# Patient Record
Sex: Male | Born: 1960 | Race: Black or African American | Hispanic: Refuse to answer | Smoking: Former smoker
Health system: Southern US, Community
[De-identification: ages and names within clinical notes are randomized; demographics above are authoritative.]

## PROBLEM LIST (undated history)

## (undated) DIAGNOSIS — E119 Type 2 diabetes mellitus without complications: Secondary | ICD-10-CM

## (undated) DIAGNOSIS — I10 Essential (primary) hypertension: Secondary | ICD-10-CM

## (undated) DIAGNOSIS — G709 Myoneural disorder, unspecified: Secondary | ICD-10-CM

## (undated) DIAGNOSIS — E785 Hyperlipidemia, unspecified: Secondary | ICD-10-CM

## (undated) DIAGNOSIS — M1990 Unspecified osteoarthritis, unspecified site: Secondary | ICD-10-CM

## (undated) HISTORY — DX: Myoneural disorder, unspecified: G70.9

## (undated) HISTORY — DX: Unspecified osteoarthritis, unspecified site: M19.90

---

## 2008-02-01 ENCOUNTER — Emergency Department (HOSPITAL_COMMUNITY): Admission: EM | Admit: 2008-02-01 | Discharge: 2008-02-01 | Payer: Self-pay | Admitting: Emergency Medicine

## 2008-11-01 ENCOUNTER — Ambulatory Visit: Payer: Self-pay | Admitting: Family Medicine

## 2008-11-01 DIAGNOSIS — I10 Essential (primary) hypertension: Secondary | ICD-10-CM | POA: Insufficient documentation

## 2008-11-01 DIAGNOSIS — K5732 Diverticulitis of large intestine without perforation or abscess without bleeding: Secondary | ICD-10-CM | POA: Insufficient documentation

## 2008-11-01 DIAGNOSIS — L02818 Cutaneous abscess of other sites: Secondary | ICD-10-CM | POA: Insufficient documentation

## 2008-11-01 DIAGNOSIS — R519 Headache, unspecified: Secondary | ICD-10-CM | POA: Insufficient documentation

## 2008-11-01 DIAGNOSIS — R51 Headache: Secondary | ICD-10-CM | POA: Insufficient documentation

## 2008-11-01 DIAGNOSIS — G4730 Sleep apnea, unspecified: Secondary | ICD-10-CM | POA: Insufficient documentation

## 2008-11-01 DIAGNOSIS — L03818 Cellulitis of other sites: Secondary | ICD-10-CM

## 2008-11-01 LAB — CONVERTED CEMR LAB: Glucose, Bld: 86 mg/dL

## 2008-11-06 DIAGNOSIS — E669 Obesity, unspecified: Secondary | ICD-10-CM | POA: Insufficient documentation

## 2008-11-09 ENCOUNTER — Encounter: Payer: Self-pay | Admitting: Family Medicine

## 2008-11-21 ENCOUNTER — Encounter: Payer: Self-pay | Admitting: Family Medicine

## 2008-11-21 LAB — CONVERTED CEMR LAB
BUN: 10 mg/dL (ref 6–23)
Basophils Absolute: 0 10*3/uL (ref 0.0–0.1)
Basophils Relative: 0 % (ref 0–1)
CO2: 25 meq/L (ref 19–32)
Calcium: 9.9 mg/dL (ref 8.4–10.5)
Chloride: 104 meq/L (ref 96–112)
Cholesterol: 200 mg/dL (ref 0–200)
Creatinine, Ser: 1.19 mg/dL (ref 0.40–1.50)
Eosinophils Absolute: 0.2 10*3/uL (ref 0.0–0.7)
Eosinophils Relative: 2 % (ref 0–5)
Glucose, Bld: 101 mg/dL — ABNORMAL HIGH (ref 70–99)
HCT: 46.2 % (ref 39.0–52.0)
HDL: 50 mg/dL (ref >39)
Hemoglobin: 14.9 g/dL (ref 13.0–17.0)
LDL Cholesterol: 135 mg/dL — ABNORMAL HIGH (ref 0–99)
Lymphocytes Relative: 52 % — ABNORMAL HIGH (ref 12–46)
Lymphs Abs: 4.2 10*3/uL — ABNORMAL HIGH (ref 0.7–4.0)
MCHC: 32.3 g/dL (ref 30.0–36.0)
MCV: 85.1 fL (ref 78.0–100.0)
Monocytes Absolute: 0.4 10*3/uL (ref 0.1–1.0)
Monocytes Relative: 5 % (ref 3–12)
Neutro Abs: 3.3 10*3/uL (ref 1.7–7.7)
Neutrophils Relative %: 41 % — ABNORMAL LOW (ref 43–77)
PSA: 0.65 ng/mL (ref 0.10–4.00)
Platelets: 168 10*3/uL (ref 150–400)
Potassium: 5 meq/L (ref 3.5–5.3)
RBC: 5.43 M/uL (ref 4.22–5.81)
RDW: 13.1 % (ref 11.5–15.5)
Sodium: 141 meq/L (ref 135–145)
TSH: 1.18 microintl units/mL (ref 0.350–4.500)
Total CHOL/HDL Ratio: 4
Triglycerides: 76 mg/dL (ref <150)
VLDL: 15 mg/dL (ref 0–40)
WBC: 8.1 10*3/uL (ref 4.0–10.5)

## 2008-11-30 ENCOUNTER — Ambulatory Visit: Payer: Self-pay | Admitting: Family Medicine

## 2008-11-30 LAB — CONVERTED CEMR LAB: OCCULT 1: NEGATIVE

## 2008-12-06 ENCOUNTER — Encounter: Payer: Self-pay | Admitting: Family Medicine

## 2009-04-18 ENCOUNTER — Telehealth: Payer: Self-pay | Admitting: Family Medicine

## 2009-04-18 ENCOUNTER — Ambulatory Visit: Payer: Self-pay | Admitting: Family Medicine

## 2009-04-18 DIAGNOSIS — J019 Acute sinusitis, unspecified: Secondary | ICD-10-CM | POA: Insufficient documentation

## 2009-04-18 DIAGNOSIS — J209 Acute bronchitis, unspecified: Secondary | ICD-10-CM | POA: Insufficient documentation

## 2010-03-07 ENCOUNTER — Telehealth (INDEPENDENT_AMBULATORY_CARE_PROVIDER_SITE_OTHER): Payer: Self-pay | Admitting: *Deleted

## 2010-03-08 ENCOUNTER — Ambulatory Visit: Payer: Self-pay | Admitting: Family Medicine

## 2010-03-08 DIAGNOSIS — R7301 Impaired fasting glucose: Secondary | ICD-10-CM | POA: Insufficient documentation

## 2010-03-08 DIAGNOSIS — R5383 Other fatigue: Secondary | ICD-10-CM

## 2010-03-08 DIAGNOSIS — R5381 Other malaise: Secondary | ICD-10-CM | POA: Insufficient documentation

## 2010-05-15 ENCOUNTER — Ambulatory Visit: Admit: 2010-05-15 | Payer: Self-pay | Admitting: Family Medicine

## 2010-05-15 ENCOUNTER — Encounter: Payer: Self-pay | Admitting: Family Medicine

## 2010-05-29 NOTE — Progress Notes (Signed)
Summary: rx into another pharm  Phone Note Call from Patient   Summary of Call: wants to get rx called into [facility]. [phone] Initial call taken by: Rudene Anda,  April 18, 2009 3:57 PM    Prescriptions: TESSALON PERLES 100 MG CAPS (BENZONATATE) Take 1 capsule by mouth three times a day  #30 x 0   Entered by:   Everitt Amber   Authorized by:   Syliva Overman MD   Signed by:   Everitt Amber on 04/18/2009   Method used:   Electronically to        [facility] (retail)       [address]       [place], [place]  [address]       Ph: [phone]       Fax: [phone]   RxID:   [id number] SEPTRA DS 800-160 MG TABS (SULFAMETHOXAZOLE-TRIMETHOPRIM) Take 1 tablet by mouth two times a day  #20 x 0   Entered by:   Everitt Amber   Authorized by:   Syliva Overman MD   Signed by:   Everitt Amber on 04/18/2009   Method used:   Electronically to        [facility] (retail)       [address]       [place], [place]  [address]       Ph: [phone]       Fax: [phone]   RxID:   [id number]

## 2010-05-29 NOTE — Letter (Signed)
Summary: Letter  Letter   Imported By: Lind Guest 11/09/2008 14:16:36  _____________________________________________________________________  External Attachment:    Type:   Image     Comment:   External Document

## 2010-05-29 NOTE — Assessment & Plan Note (Signed)
Summary: 4 month follow up/cnd   Vital Signs:  Patient profile:   50 year old male Height:      69.5 inches Weight:      241.25 pounds BMI:     35.24 Pulse rate:   104 / minute Pulse rhythm:   regular Resp:     16 per minute BP sitting:   162 / 80  (left arm)  Vitals Entered By: Worthy Keeler LPN (April 18, 2009 2:02 PM)  Nutrition Counseling: Patient's BMI is greater than 25 and therefore counseled on weight management options. CC: follow-up visit Is Patient Diabetic? No Pain Assessment Patient in pain? no        Primary Care Provider:  Syliva Overman MD  CC:  follow-up visit.  History of Present Illness: Reports  thathe has been doing well up until the last 3 weeks when he developed progressiuve respiratory symptoms. Denies recent fever or chills.  Denies chest pain, palpitations, PND, orthopnea or leg swelling. Denies abdominal pain, nausea, vomitting, diarrhea or constipation. Denies change in bowel movements or bloody stool. Denies dysuria , frequency, incontinence or hesitancy. Denies  joint pain, swelling, or reduced mobility. Denies headaches, vertigo, seizures. Denies depression, anxiety or insomnia. Denies  rash, lesions, or itch.     Allergies (verified): 1)  ! Demerol  Review of Systems      See HPI General:  Denies chills and fever. Eyes:  Denies blurring and discharge. ENT:  Complains of hoarseness, nasal congestion, postnasal drainage, sinus pressure, and sore throat; 3 week history, just getting better , had yellow nasal drainage. CV:  Denies lightheadness, palpitations, and shortness of breath with exertion; pt remains physically active, however has noted recent exertional fatigue which is a cause for concern. Resp:  Complains of cough and sputum productive; 3 week history.clearing up in the past 3 days, yellow sputum, 2 sundays ago he developed flu like symptoms. GI:  Denies abdominal pain, bloody stools, change in bowel habits,  constipation, diarrhea, and indigestion. GU:  Denies dysuria, nocturia, urinary frequency, and urinary hesitancy. MS:  Denies joint pain and stiffness. Derm:  Denies itching and rash. Neuro:  Denies headaches, seizures, and sensation of room spinning. Psych:  Denies anxiety and depression. Endo:  Denies cold intolerance, excessive hunger, excessive thirst, excessive urination, heat intolerance, polyuria, and weight change. Heme:  Denies abnormal bruising and bleeding. Allergy:  Denies hives or rash and sneezing.  Physical Exam  General:  Well-developed,obese,in no acute distress; alert,appropriate and cooperative throughout examination HEENT: No facial asymmetry,  EOMI, positive sinus tenderness, TM's Clear, oropharynx  pink and moist.   Chest:decreased air entry, scattered crackles and wheezes CVS: S1, S2, No murmurs, No S3.   Abd: Soft, Nontender.  MS: Adequate ROM spine, hips, shoulders and knees.  Ext: No edema.   CNS: CN 2-12 intact, power tone and sensation normal throughout.   Skin: Intact, no visible lesions or rashes.  Psych: Good eye contact, normal affect.  Memory intact, not anxious or depressed appearing.    Impression & Recommendations:  Problem # 1:  ACUTE BRONCHITIS (ICD-466.0) Assessment Comment Only  His updated medication list for this problem includes:    Septra Ds 800-160 Mg Tabs (Sulfamethoxazole-trimethoprim) .Marland Kitchen... Take 1 tablet by mouth two times a day    Tessalon Perles 100 Mg Caps (Benzonatate) .Marland Kitchen... Take 1 capsule by mouth three times a day  Problem # 2:  ACUTE SINUSITIS, UNSPECIFIED (ICD-461.9) Assessment: Comment Only  His updated medication list for this problem includes:  Septra Ds 800-160 Mg Tabs (Sulfamethoxazole-trimethoprim) .Marland Kitchen... Take 1 tablet by mouth two times a day    Tessalon Perles 100 Mg Caps (Benzonatate) .Marland Kitchen... Take 1 capsule by mouth three times a day  Problem # 3:  HYPERTENSION (ICD-401.9) Assessment: Deteriorated  His  updated medication list for this problem includes:    Maxzide-25 37.5-25 Mg Tabs (Triamterene-hctz) .Marland Kitchen... Take 1 tablet by mouth once a day  Orders: T-Basic Metabolic Panel [id number])  BP today: 162/80 Prior BP: 124/94 (11/30/2008)  Labs Reviewed: K+: 5.0 (11/21/2008) Creat: : 1.19 (11/21/2008)   Chol: 200 (11/21/2008)   HDL: 50 (11/21/2008)   LDL: 135 (11/21/2008)   TG: 76 (11/21/2008)  Problem # 4:  OBESITY, UNSPECIFIED (ICD-278.00) Assessment: Deteriorated  Ht: 69.5 (04/18/2009)   Wt: 241.25 (04/18/2009)   BMI: 35.24 (04/18/2009)  Complete Medication List: 1)  Maxzide-25 37.5-25 Mg Tabs (Triamterene-hctz) .... Take 1 tablet by mouth once a day 2)  Septra Ds 800-160 Mg Tabs (Sulfamethoxazole-trimethoprim) .... Take 1 tablet by mouth two times a day 3)  Tessalon Perles 100 Mg Caps (Benzonatate) .... Take 1 capsule by mouth three times a day  Other Orders: T-Lipid Profile ([id number])  Patient Instructions: 1)  Please schedule a follow-up appointment in 4 months. 2)  Nurse BP check in 6 weeks 3)  It is important that you exercise regularly at least 20 minutes 5 times a week. If you develop chest pain, have severe difficulty breathing, or feel very tired , stop exercising immediately and seek medical attention. 4)  You need to lose weight. Consider a lower calorie diet and regular exercise.  5)  You are  being treated for sinusitis and bronchitis, meds have been sent in 6)  BMP prior to visit, ICD-9: 7)  Lipid Panel prior to visit, ICD-9:   fating in 4 months Prescriptions: TESSALON PERLES 100 MG CAPS (BENZONATATE) Take 1 capsule by mouth three times a day  #30 x 0   Entered and Authorized by:   Syliva Overman MD   Signed by:   Syliva Overman MD on 04/18/2009   Method used:   Electronically to        [facility] (retail)       [address]       [place], [place]  [address]       Ph: [phone]       Fax: [phone]   RxID:    [id number] SEPTRA DS 800-160 MG TABS (SULFAMETHOXAZOLE-TRIMETHOPRIM) Take 1 tablet by mouth two times a day  #20 x 0   Entered and Authorized by:   Syliva Overman MD   Signed by:   Syliva Overman MD on 04/18/2009   Method used:   Electronically to        [facility]* (retail)       [address]       [place], [place]  [address]       Ph: [phone]       Fax: [phone]   RxID:   [id number]

## 2010-05-29 NOTE — Assessment & Plan Note (Signed)
Summary: needs a note for work, and discuss other things/slj   Vital Signs:  Patient profile:   50 year old male Height:      69.5 inches Weight:      249.50 pounds BMI:     36.45 Pulse rate:   72 / minute Pulse rhythm:   regular BP sitting:   140 / 100  (right arm)  Vitals Entered By: Mauricia Area CMA (March 08, 2010 12:56 PM)  Nutrition Counseling: Patient's BMI is greater than 25 and therefore counseled on weight management options. CC: Follow up Comments Not currently taking any medications   Primary Care Provider:  Syliva Overman MD  CC:  Follow up.  History of Present Illness: 5 day h/o feverish, generalised body aches, chills, out of body type feeling, head congestion, yeloow green nasal drainage, with cough productive of yellow sputum. Has not been taking the BP meds, states cause Ed but will resume. Pt has gained a significant amt of weight is experiencing fatigue , and has plans to resume healthy lifestyle.  Current Medications (verified): 1)  None  Allergies (verified): 1)  ! Demerol  Review of Systems      See HPI General:  Complains of chills and fatigue. Eyes:  Denies discharge, double vision, eye pain, and itching. ENT:  Complains of nasal congestion, postnasal drainage, and sinus pressure; denies sore throat. CV:  Denies chest pain or discomfort, fatigue, palpitations, and swelling of feet. Resp:  Complains of cough, shortness of breath, sputum productive, and wheezing. GI:  Denies abdominal pain, constipation, diarrhea, nausea, and vomiting. GU:  Denies dysuria and urinary frequency. MS:  Denies joint pain and stiffness. Derm:  Denies itching, lesion(s), and rash. Neuro:  Complains of headaches. Psych:  Denies anxiety and depression. Endo:  Denies cold intolerance, excessive hunger, excessive thirst, and excessive urination. Heme:  Denies abnormal bruising and bleeding. Allergy:  Complains of seasonal allergies.  Physical Exam  General:   Well-developed,obese,in no acute distress; alert,appropriate and cooperative throughout examination HEENT: No facial asymmetry,  EOMI, frontal and maxillary sinus tenderness, TM's Clear, oropharynx  pink and moist.   Chest: decreased air entry, scattered crackles and wheezes CVS: S1, S2, No murmurs, No S3.   Abd: Soft, Nontender.  MS: Adequate ROM spine, hips, shoulders and knees.  Ext: No edema.   CNS: CN 2-12 intact, power tone and sensation normal throughout.   Skin: Intact, no visible lesions or rashes.  Psych: Good eye contact, normal affect.  Memory intact, not anxious or depressed appearing.    Impression & Recommendations:  Problem # 1:  ACUTE BRONCHITIS (ICD-466.0) Assessment Comment Only  The following medications were removed from the medication list:    Septra Ds 800-160 Mg Tabs (Sulfamethoxazole-trimethoprim) .Marland Kitchen... Take 1 tablet by mouth two times a day    Tessalon Perles 100 Mg Caps (Benzonatate) .Marland Kitchen... Take 1 capsule by mouth three times a day His updated medication list for this problem includes:    Penicillin V Potassium 500 Mg Tabs (Penicillin v potassium) .Marland Kitchen... Take 1 tablet by mouth three times a day    Tessalon Perles 100 Mg Caps (Benzonatate) .Marland Kitchen... Take 1 capsule by mouth three times a day  Problem # 2:  ACUTE SINUSITIS, UNSPECIFIED (ICD-461.9) Assessment: Comment Only  The following medications were removed from the medication list:    Septra Ds 800-160 Mg Tabs (Sulfamethoxazole-trimethoprim) .Marland Kitchen... Take 1 tablet by mouth two times a day    Tessalon Perles 100 Mg Caps (Benzonatate) .Marland Kitchen... Take 1  capsule by mouth three times a day His updated medication list for this problem includes:    Penicillin V Potassium 500 Mg Tabs (Penicillin v potassium) .Marland Kitchen... Take 1 tablet by mouth three times a day    Tessalon Perles 100 Mg Caps (Benzonatate) .Marland Kitchen... Take 1 capsule by mouth three times a day  Problem # 3:  HYPERTENSION (ICD-401.9) Assessment: Unchanged  The following  medications were removed from the medication list:    Maxzide-25 37.5-25 Mg Tabs (Triamterene-hctz) .Marland Kitchen... Take 1 tablet by mouth once a day His updated medication list for this problem includes:    Amlodipine Besylate 5 Mg Tabs (Amlodipine besylate) .Marland Kitchen... Take 1 tablet by mouth once a day  Orders: T-Basic Metabolic Panel [id number])  BP today: 140/100 Prior BP: 162/80 (04/18/2009)  Labs Reviewed: K+: 5.0 (11/21/2008) Creat: : 1.19 (11/21/2008)   Chol: 200 (11/21/2008)   HDL: 50 (11/21/2008)   LDL: 135 (11/21/2008)   TG: 76 (11/21/2008)  Problem # 4:  OBESITY, UNSPECIFIED (ICD-278.00) Assessment: Deteriorated  Ht: 69.5 (03/08/2010)   Wt: 249.50 (03/08/2010)   BMI: 36.45 (03/08/2010)  Complete Medication List: 1)  Amlodipine Besylate 5 Mg Tabs (Amlodipine besylate) .... Take 1 tablet by mouth once a day 2)  Penicillin V Potassium 500 Mg Tabs (Penicillin v potassium) .... Take 1 tablet by mouth three times a day 3)  Tessalon Perles 100 Mg Caps (Benzonatate) .... Take 1 capsule by mouth three times a day  Other Orders: T-Lipid Profile ([id number]) T-TSH [phone]) T-CBC w/Diff ([id number]) T-PSA (10272-53664) T- Hemoglobin A1C (40347-42595)  Patient Instructions: 1)  CPE in 6 to 8 weeks. 2)  You are being treated for sinusitis and bronchitis. 3)  You need to resume BP meds. Pls call if problems. 4)  It is important that you exercise regularly at least 30 minutes 5 times a week. If you develop chest pain, have severe difficulty breathing, or feel very tired , stop exercising immediately and seek medical attention. 5)  You need to lose weight. Consider a lower calorie diet and regular exercise.  6)  BMP prior to visit, ICD-9: 7)  Lipid Panel prior to visit, ICD-9: 8)  TSH prior to visit, ICD-9: 9)  CBC w/ Diff prior to visit, ICD-9: fasting labs today 10)  PSA prior to visit, ICD-9: 11)  HbgA1C prior to visit, ICD-9: 12)  Work excuse from Nov 6 to Mar 07, 2010 Prescriptions: TESSALON PERLES 100 MG CAPS (BENZONATATE) Take 1 capsule by mouth three times a day  #21 x 0   Entered and Authorized by:   Syliva Overman MD   Signed by:   Syliva Overman MD on 03/08/2010   Method used:   Electronically to        [facility] (retail)       [address]       [place], [place]  [address]       Ph: [phone]       Fax: [phone]   RxID:   [id number] PENICILLIN V POTASSIUM 500 MG TABS (PENICILLIN V POTASSIUM) Take 1 tablet by mouth three times a day  #21 x 0   Entered and Authorized by:   Syliva Overman MD   Signed by:   Syliva Overman MD on 03/08/2010   Method used:   Electronically to        [facility] (retail)       [address]       [place], [place]  [address]       Ph: [phone]       Fax: [phone]   RxID:   [id number] AMLODIPINE BESYLATE 5 MG TABS (AMLODIPINE BESYLATE) Take 1 tablet by mouth once a day  #30 x 3   Entered and Authorized by:   Syliva Overman MD   Signed by:   Syliva Overman MD on 03/08/2010   Method used:   Electronically to        [facility] (retail)       [address]       [place], [place]  [address]       Ph: [phone]       Fax: [phone]   RxID:   [id number]    Orders Added: 1)  Est. Patient Level IV [71062] 2)  T-Basic Metabolic Panel [id number] 3)  T-Lipid Profile [[id number]] 4)  T-TSH [[id number]] 5)  T-CBC w/Diff [[id number]] 6)  T-PSA [93810-17510] 7)  T- Hemoglobin A1C [83036-23375]

## 2010-05-29 NOTE — Assessment & Plan Note (Signed)
Summary: new patient   Vital Signs:  Patient profile:   50 year old male Height:      69.5 inches Weight:      225.56 pounds BMI:     32.95 Pulse rate:   71 / minute Pulse rhythm:   regular Resp:     16 per minute BP sitting:   150 / 98  (left arm)  Vitals Entered By: Worthy Keeler LPN (November 01, 452 3:51 PM)  Nutrition Counseling: Patient's BMI is greater than 25 and therefore counseled on weight management options. CC: new patient Is Patient Diabetic? No Pain Assessment Patient in pain? no        CC:  new patient.  History of Present Illness: Patient reports doing well.  Denies any recent fever or chills.  Denies any appetite change or change in bowel movements. Patient denies depression or anxiety.he is concerned about lackof sleep which he is getting espescially with the birth of his 86 month old infant daughter.  The pt denioes head or chest congestion. He denies sinus pressure, sore throat or productive cough. He denies dysuria, nocturia or frequency. he has concerns bout new headaches, a rash with swelling of the legs over thepast 1 week. he is concerned about recent weight gain which he states started when he stopped exercising.    Preventive Screening-Counseling & Management  Alcohol-Tobacco     Smoking Status: never  Caffeine-Diet-Exercise     Does Patient Exercise: yes      Drug Use:  no.    Past History:  Past Medical History: Current Problems:  HYPERTENSION (ICD-401.9)  approx 2007 DIVERTICULITIS, ACUTE (ICD-562.11) 2001, hospitalised at [facility] for 9 days Rightenal colic on 2 previous occasions, weekend stay twice , passed spontaneously both times, most recent 2009, then 2004 Headaches, occpital x 3 wee, sleeps about 4 hrs per night  Past Surgical History: Denies surgical history  Family History: Mother living- HTN Father deceased- CHF, HTN, DM died at age 60 of CHf 1 sister- sickle cell trait  Social History: Employed-  security Married 6 children (2 adults, 2 children, 2 small children) Never Smoked Alcohol use-no Drug use-no Regular exercise-yes Religion affecting care Smoking Status:  never Drug Use:  no Does Patient Exercise:  yes  Review of Systems      See HPI General:  Complains of sleep disorder; denies chills and fever; 4 hrs sleep. ENT:  Denies hoarseness, nasal congestion, and sinus pressure. CV:  Denies chest pain or discomfort, difficulty breathing while lying down, palpitations, shortness of breath with exertion, and swelling of hands. Resp:  See HPI; Complains of excessive snoring; denies cough, sputum productive, and wheezing. GI:  Denies abdominal pain, bloody stools, constipation, diarrhea, nausea, and vomiting. GU:  See HPI. MS:  Denies joint pain, loss of strength, low back pain, muscle aches, and stiffness. Derm:  Complains of itching, lesion(s), and rash; puriic red rash on on right leg x 6 days, felt a bite while picking berries, the lesion has progressively worsened, has noted yellow drainage, and and vesicular lesions, using zinc oxide.. Neuro:  Complains of headaches; denies numbness, poor balance, seizures, and sensation of room spinning; occipital, new severe headaches, no associated weakness or numbness.Marland Kitchen Psych:  See HPI. Endo:  Denies cold intolerance, excessive hunger, excessive thirst, excessive urination, heat intolerance, polyuria, and weight change.  Physical Exam  General:  alert, well-hydrated, and overweight-appearing. HEENT: No facial asymmetry,  EOMI, No sinus tenderness, TM's Clear, oropharynx  pink and moist.  Chest: Clear to auscultation bilaterally.  CVS: S1, S2, No murmurs, No S3.   Abd: Soft, Nontender.  MS: Adequate ROM spine, hips, shoulders and knees.  Ext: No edema.   CNS: CN 2-12 intact, power tone and sensation normal throughout.   Skin:extensive dermatitis of feet end legs with hyperpigmentation and vesicular lesions Psych: Good eye contact,  normal affect.  Memory intact, not anxious or depressed appearing.      Impression & Recommendations:  Problem # 1:  SLEEP APNEA, CHRONIC (ICD-780.57) Assessment Comment Only  Orders: Neurology Referral (Neuro), compelling h/o breath holding, snoring excessive dayrime sleepiness, study indicated  Problem # 2:  CELLULITIS AND ABSCESS OF OTHER SPECIFIED SITE 770 770 0575.8) Assessment: Comment Only  His updated medication list for this problem includes:    Doxycycline Hyclate 100 Mg Cpep (Doxycycline hyclate) .Marland Kitchen... Take 1 capsule by mouth two times a day  Orders: Depo- Medrol 80mg  (J1040) Rocephin  250mg  (A6301) Admin of Therapeutic Inj  intramuscular or subcutaneous (60109)  Problem # 3:  HEADACHE (ICD-784.0) Assessment: Deteriorated  Orders: Ketorolac-Toradol 15mg  (N2355) Neurology Referral (Neuro) Radiology Referral (Radiology)  Problem # 4:  HYPERTENSION (ICD-401.9) Assessment: Comment Only  His updated medication list for this problem includes:    Maxzide-25 37.5-25 Mg Tabs (Triamterene-hctz) .Marland Kitchen... Take 1 tablet by mouth once a day  BP today: 150/98, pt has been untreated in the past, new dx  Problem # 5:  DIVERTICULITIS, ACUTE (ICD-562.11) Assessment: Comment Only stable at this time  Problem # 6:  OBESITY, UNSPECIFIED (ICD-278.00) Assessment: Comment Only  Ht: 69.5 (11/01/2008)   Wt: 225.56 (11/01/2008)   BMI: 32.95 (11/01/2008)  Complete Medication List: 1)  Maxzide-25 37.5-25 Mg Tabs (Triamterene-hctz) .... Take 1 tablet by mouth once a day 2)  Prednisone (pak) 5 Mg Tabs (Prednisone) .... Use as directed 3)  Doxycycline Hyclate 100 Mg Cpep (Doxycycline hyclate) .... Take 1 capsule by mouth two times a day  Other Orders: T-Basic Metabolic Panel [phone]) T-Lipid Profile [phone]) T-CBC w/Diff [phone]) T-PSA [phone]) T-TSH [phone]) Tdap => 74yrs IM (81829) Admin 1st Vaccine (93716)  Patient Instructions: 1)  CPE in 5 weeks. 2)   You are being treated for dermatitisof both feet as wel las infection ofthe feet. pls do not scratch the area and avoid using any topical agents.pls call back if it is no better in the next 2 days for derm eval.  3)  your blood pressure is high yoiu are being started on medication for this. 4)    5)  BMP prior to visit, ICD-9: 6)  Lipid Panel prior to visit, ICD-9:  fasting in 5 weeks pls. 7)  TSH prior to visit, ICD-9: 8)  CBC w/ Diff prior to visit, ICD-9: 9)  PSA prior to visit, ICD-9: 10)  You will be referred to neurology to eval your headaches and for a scan Prescriptions: DOXYCYCLINE HYCLATE 100 MG CPEP (DOXYCYCLINE HYCLATE) Take 1 capsule by mouth two times a day  #20 x 0   Entered and Authorized by:   Syliva Overman MD   Signed by:   Syliva Overman MD on 11/01/2008   Method used:   Electronically to        [facility]* (retail)       [address]       [place], [place]  [address]       Ph: [phone]       Fax: [phone]   RxID:   [id number]  PREDNISONE (PAK) 5 MG TABS (PREDNISONE) Use as directed  #21 x 0   Entered and Authorized by:   Syliva Overman MD   Signed by:   Syliva Overman MD on 11/01/2008   Method used:   Electronically to        [facility]* (retail)       [address]       [place], [place]  [address]       Ph: [phone]       Fax: [phone]   RxID:   [id number] MAXZIDE-25 37.5-25 MG TABS (TRIAMTERENE-HCTZ) Take 1 tablet by mouth once a day  #30 x 2   Entered and Authorized by:   Syliva Overman MD   Signed by:   Syliva Overman MD on 11/01/2008   Method used:   Print then Give to Patient   RxID:   [id number]    Tetanus/Td Vaccine    Vaccine Type: Tdap    Site: right deltoid    Mfr: [email or web address]    Dose: 0.5 ml    Route: IM    Given by: Worthy Keeler LPN    Exp. Date: 06/22/2010    Lot #: [id number]    VIS given: 03/17/07 version given November 01, 2008.    Medication  Administration  Injection # 1:    Medication: Depo- Medrol 80mg     Diagnosis: CELLULITIS AND ABSCESS OF OTHER SPECIFIED SITE 772 722 6236.8)    Route: IM    Site: RUOQ gluteus    Exp Date: 1/13    Lot #: [id number]    Mfr: Pharmacia    Patient tolerated injection without complications    Given by: Worthy Keeler LPN (November 01, 624 4:48 PM)  Injection # 2:    Medication: Ketorolac-Toradol 15mg     Diagnosis: HEADACHE (ICD-784.0)    Route: IM    Site: LUOQ gluteus    Exp Date: 05/30/2010    Lot #: [id number]    Mfr: [email or web address]    Comments: toradol 60mg  given    Patient tolerated injection without complications    Given by: Worthy Keeler LPN (November 01, 7033 4:54 PM)  Injection # 3:    Medication: Rocephin  250mg     Diagnosis: CELLULITIS AND ABSCESS OF OTHER SPECIFIED SITE (ICD-682.8)    Route: IM    Site: R deltoid    Exp Date: 4/12    Lot #: [id number]    Mfr: [email or web address]    Comments: rocephin 500mg  given    Patient tolerated injection without complications    Given by: Worthy Keeler LPN (November 01, 8297 4:55 PM)  Orders Added: 1)  New Patient Level IV [99204] 2)  T-Basic Metabolic Panel [phone] 3)  T-Lipid Profile [[id number]] 4)  T-CBC w/Diff [[id number]] 5)  T-PSA [85277-82423] 6)  T-TSH [53614-43154] 7)  Tdap => 13yrs IM [90715] 8)  Admin 1st Vaccine [90471] 9)  Depo- Medrol 80mg  [J1040] 10)  Ketorolac-Toradol 15mg  [J1885] 11)  Rocephin  250mg  [J0696] 12)  Admin of Therapeutic Inj  intramuscular or subcutaneous [96372] 13)  Neurology Referral [Neuro] 14)  Neurology Referral [Neuro] 15)  Radiology Referral [Radiology]   Laboratory Results   Blood Tests   Date/Time Received: 11/01/08 4:30p Date/Time Reported: 11/01/08 4:30p  Glucose (random): 86 mg/dL   (Normal Range: 00-867)

## 2010-05-29 NOTE — Assessment & Plan Note (Signed)
Summary: physcial   Vital Signs:  Patient profile:   50 year old male Height:      69.5 inches Weight:      217 pounds BMI:     31.70 O2 Sat:      97 % Pulse rate:   87 / minute Pulse rhythm:   regular Resp:     16 per minute BP sitting:   124 / 94  (left arm) Cuff size:   large  Vitals Entered By: Everitt Amber (November 30, 2008 3:10 PM)  Nutrition Counseling: Patient's BMI is greater than 25 and therefore counseled on weight management options. CC: PHYSICAL Is Patient Diabetic? No  Vision Screening:Left eye with correction: 20 / 15 Right eye with correction: 20 / 15 Both eyes with correction: 20 / 15  Color vision testing: normal      Vision Entered By: Everitt Amber (November 30, 2008 3:19 PM)   CC:  PHYSICAL.  History of Present Illness: Pt is in for a complete exam and folow up on severe cellulitis.  He reports excellent responseto the meds for the skin infection. He has also changed his diet and commited to regular exercise with excellent weight loss .He denies intolerance  to the antihypertensive medication that was recently started, He has had no recent fver or chills,. He dneies head or chest congestion. He denies poor stream, nocturia or frequency. He has noted problems with erectile function since starting the bP med.   Current Medications (verified): 1)  Maxzide-25 37.5-25 Mg Tabs (Triamterene-Hctz) .... Take 1 Tablet By Mouth Once A Day  Allergies (verified): 1)  ! Demerol  Review of Systems General:  Denies fever. Eyes:  Denies blurring and discharge. ENT:  Denies earache, hoarseness, nasal congestion, sinus pressure, and sore throat. CV:  Denies chest pain or discomfort, leg cramps with exertion, near fainting, palpitations, shortness of breath with exertion, and swelling of feet. Resp:  Denies cough and sputum productive. GI:  Denies abdominal pain, constipation, diarrhea, indigestion, nausea, and vomiting blood. GU:  See HPI. MS:  Denies joint pain,  low back pain, mid back pain, and stiffness. Derm:  See HPI. Neuro:  Denies falling down, headaches, sensation of room spinning, and tingling. Psych:  Denies anxiety, depression, irritability, and mental problems. Endo:  Denies cold intolerance, excessive hunger, excessive thirst, excessive urination, and polyuria. Heme:  Denies abnormal bruising and bleeding. Allergy:  Denies hives or rash and itching eyes.  Physical Exam  General:  alert, well-hydrated, and overweight-appearing.   Head:  Normocephalic and atraumatic without obvious abnormalities. No apparent alopecia or balding. Eyes:  No corneal or conjunctival inflammation noted. EOMI. Perrla. Funduscopic exam benign, without hemorrhages, exudates or papilledema. Vision grossly normal. Ears:  External ear exam shows no significant lesions or deformities.  Otoscopic examination reveals clear canals, tympanic membranes are intact bilaterally without bulging, retraction, inflammation or discharge. Hearing is grossly normal bilaterally. Nose:  External nasal examination shows no deformity or inflammation. Nasal mucosa are pink and moist without lesions or exudates. Mouth:  Oral mucosa and oropharynx without lesions or exudates.  Teeth in good repair. Neck:  No deformities, masses, or tenderness noted. Chest Wall:  No deformities, masses, tenderness or gynecomastia noted. Breasts:  No masses or gynecomastia noted Lungs:  Normal respiratory effort, chest expands symmetrically. Lungs are clear to auscultation, no crackles or wheezes. Heart:  Normal rate and regular rhythm. S1 and S2 normal without gallop, murmur, click, rub or other extra sounds. Abdomen:  Bowel  sounds positive,abdomen soft and non-tender without masses, organomegaly or hernias noted. Rectal:  No external abnormalities noted. Normal sphincter tone. No rectal masses or tenderness.guaic negative stool Genitalia:  Testes bilaterally descended without nodularity, tenderness or  masses. No scrotal masses or lesions. No penis lesions or urethral discharge. Prostate:  Prostate gland firm and smooth, no enlargement, nodularity, tenderness, mass, asymmetry or induration. Msk:  No deformity or scoliosis noted of thoracic or lumbar spine.   Pulses:  R and L carotid,radial,femoral,dorsalis pedis and posterior tibial pulses are full and equal bilaterally Extremities:  No clubbing, cyanosis, edema, or deformity noted with normal full range of motion of all joints.   Neurologic:  No cranial nerve deficits noted. Station and gait are normal. Plantar reflexes are down-going bilaterally. DTRs are symmetrical throughout. Sensory, motor and coordinative functions appear intact. Skin:  Intact without suspicious lesions or rashes Cervical Nodes:  No lymphadenopathy noted Axillary Nodes:  No palpable lymphadenopathy Inguinal Nodes:  No significant adenopathy Psych:  Cognition and judgment appear intact. Alert and cooperative with normal attention span and concentration. No apparent delusions, illusions, hallucinations   Impression & Recommendations:  Problem # 1:  PHYSICAL EXAMINATION (ICD-V70.0) Assessment Comment Only healthy lifestyle discussed and encouraged . pt applauded on the changes he has made which have been succesfukl and encouraged to continue same.  Problem # 2:  OBESITY, UNSPECIFIED (ICD-278.00) Assessment: Improved  Ht: 69.5 (11/30/2008)   Wt: 217 (11/30/2008)   BMI: 31.70 (11/30/2008)  Problem # 3:  CELLULITIS AND ABSCESS OF OTHER SPECIFIED SITE [phone]) Assessment: Improved  The following medications were removed from the medication list:    Doxycycline Hyclate 100 Mg Cpep (Doxycycline hyclate) .Marland Kitchen... Take 1 capsule by mouth two times a day  Problem # 4:  HYPERTENSION (ICD-401.9) Assessment: Improved  His updated medication list for this problem includes:    Maxzide-25 37.5-25 Mg Tabs (Triamterene-hctz) .Marland Kitchen... Take 1 tablet by mouth once a  day  Orders: EKG w/ Interpretation (93000), needs to be repeated, original sent to Dr. Dietrich Pates for review appropriate lead placement discussed, pt to be asked to come in when able to have thias repeated before card referral. He is asymptomatic at this time.  BP today: 124/94 Prior BP: 150/98 (11/01/2008)  Labs Reviewed: K+: 5.0 (11/21/2008) Creat: : 1.19 (11/21/2008)   Chol: 200 (11/21/2008)   HDL: 50 (11/21/2008)   LDL: 135 (11/21/2008)   TG: 76 (11/21/2008)  Problem # 5:  HEADACHE (ICD-784.0) Assessment: Improved  Orders: EKG w/ Interpretation (93000)  Complete Medication List: 1)  Maxzide-25 37.5-25 Mg Tabs (Triamterene-hctz) .... Take 1 tablet by mouth once a day  Other Orders: Hemoccult Guaiac-1 spec.(in office) (04540)  Patient Instructions: 1)  Please schedule a follow-up appointment in 4 months. 2)  It is important that you exercise regularly at least 20 minutes 5 times a week. If you develop chest pain, have severe difficulty breathing, or feel very tired , stop exercising immediately and seek medical attention. 3)  You need to lose weight. Consider a lower calorie diet and regular exercise.  4)  blood pressure is much improved, 124/90. 5)  pls continue all the greatr lifestyle changes.Continue yout current tmed. 6)  pLS keep your sugar and carb intake down so that you do not become a diabetic    Laboratory Results  Date/Time Received: 11/30/08 Date/Time Reported: 11/30/08  Stool - Occult Blood Hemmoccult #1: negative Date: 11/30/2008 Comments: [id number] L 3/12 118 10/12    Appended Document: physcial pls contact the pt andask  him to come in at his convenience , asap , for a rept eKG, best wait till i get back to do it, the Monday would be a good day, nop additional chg, explain that i had the cardiologist review the EKG and it needs to be repeated before any decision can be made as to whether it isabnormal to need a referral to cardiology. let him know that the same  card will review the rept EKG, sorry about the inconvenience  Appended Document: physcial Returned call, no answer  Appended Document: physcial Will come in Monday

## 2010-05-29 NOTE — Progress Notes (Signed)
Summary: Please Help  Phone Note Call from Patient   Summary of Call: patient states he has had a virus at the beginning of the week, his employer states he needs a doctor's note in order to go back to work, I informed him we didn't have an appt. this week nor next week.  He wants to come in and discuss the note for work and some other things. Please advise where I can put patient. Initial call taken by: Curtis Sites,  March 07, 2010 8:21 AM  Follow-up for Phone Call        patient has not been seen in this office in a year Follow-up by: Adella Hare LPN,  March 07, 2010 10:26 AM  Additional Follow-up for Phone Call Additional follow up Details #1::        needs to go to urgent care4 if there are no abvailable openings, pls explain to him     Additional Follow-up for Phone Call Additional follow up Details #2::    patient wants to come in here, I put him on schedule for tomorrow at 1. Follow-up by: Curtis Sites,  March 07, 2010 2:29 PM

## 2010-05-31 NOTE — Letter (Signed)
Summary: 1st missed letter  1st missed letter   Imported By: Lind Guest 05/16/2010 10:25:32  _____________________________________________________________________  External Attachment:    Type:   Image     Comment:   External Document

## 2011-01-29 LAB — URINE MICROSCOPIC-ADD ON

## 2011-01-29 LAB — URINALYSIS, ROUTINE W REFLEX MICROSCOPIC
Bilirubin Urine: NEGATIVE
Glucose, UA: NEGATIVE
Leukocytes, UA: NEGATIVE
Nitrite: NEGATIVE
Specific Gravity, Urine: >1.03 — ABNORMAL HIGH
Urobilinogen, UA: 0.2
pH: 5

## 2011-03-18 ENCOUNTER — Encounter: Payer: Self-pay | Admitting: Family Medicine

## 2011-03-28 ENCOUNTER — Encounter: Payer: Self-pay | Admitting: Family Medicine

## 2011-03-28 ENCOUNTER — Ambulatory Visit (INDEPENDENT_AMBULATORY_CARE_PROVIDER_SITE_OTHER): Payer: PRIVATE HEALTH INSURANCE | Admitting: Family Medicine

## 2011-03-28 VITALS — BP 150/84 | HR 128 | Temp 99.9°F | Resp 18 | Ht 69.5 in | Wt 264.0 lb

## 2011-03-28 DIAGNOSIS — R7301 Impaired fasting glucose: Secondary | ICD-10-CM

## 2011-03-28 DIAGNOSIS — I10 Essential (primary) hypertension: Secondary | ICD-10-CM

## 2011-03-28 DIAGNOSIS — E669 Obesity, unspecified: Secondary | ICD-10-CM

## 2011-03-28 DIAGNOSIS — R5381 Other malaise: Secondary | ICD-10-CM

## 2011-03-28 DIAGNOSIS — J111 Influenza due to unidentified influenza virus with other respiratory manifestations: Secondary | ICD-10-CM

## 2011-03-28 DIAGNOSIS — R5383 Other fatigue: Secondary | ICD-10-CM

## 2011-03-28 DIAGNOSIS — R51 Headache: Secondary | ICD-10-CM

## 2011-03-28 DIAGNOSIS — J019 Acute sinusitis, unspecified: Secondary | ICD-10-CM

## 2011-03-28 DIAGNOSIS — K5732 Diverticulitis of large intestine without perforation or abscess without bleeding: Secondary | ICD-10-CM

## 2011-03-28 DIAGNOSIS — J209 Acute bronchitis, unspecified: Secondary | ICD-10-CM

## 2011-03-28 MED ORDER — SULFAMETHOXAZOLE-TRIMETHOPRIM 800-160 MG PO TABS: 1.0000 | ORAL_TABLET | Freq: Two times a day (BID) | ORAL | Status: AC

## 2011-03-28 MED ORDER — CEFTRIAXONE SODIUM 1 G IJ SOLR
500.0000 mg | Freq: Once | INTRAMUSCULAR | Status: AC
  Administered 2011-03-28: 500 mg via INTRAMUSCULAR

## 2011-03-28 MED ORDER — TRIAMTERENE-HCTZ 37.5-25 MG PO TABS: 1.0000 | ORAL_TABLET | Freq: Every day | ORAL | Status: DC

## 2011-03-28 MED ORDER — KETOROLAC TROMETHAMINE 60 MG/2ML IJ SOLN
60.0000 mg | Freq: Once | INTRAMUSCULAR | Status: AC
  Administered 2011-03-28: 60 mg via INTRAMUSCULAR

## 2011-03-28 MED ORDER — BENZONATATE 100 MG PO CAPS: 100.0000 mg | ORAL_CAPSULE | Freq: Four times a day (QID) | ORAL | Status: DC | PRN

## 2011-03-28 MED ORDER — PROMETHAZINE-DM 6.25-15 MG/5ML PO SYRP: ORAL_SOLUTION | ORAL | Status: AC

## 2011-03-28 NOTE — Progress Notes (Signed)
  Subjective:    Patient ID: Roger Prince, male    DOB: October 05, 1960, 50 y.o.   MRN: [id number]  HPI 4 day h/o body aches, chills, cough productive of green sputum and headache. Prior to this has been well, except has gained a lot of weight, stopped blood pressure medication ad has not been exercising   Review of Systems See HPI  Denies chest pains, palpitations and leg swelling Denies abdominal pain, nausea, vomiting,diarrhea or constipation.   Denies dysuria, frequency, hesitancy or incontinence. Denies joint pain, swelling and limitation in mobility. Denies seizures, numbness, or tingling. Denies depression, anxiety or insomnia. Denies skin break down or rash.        Objective:   Physical Exam Patient alert and oriented and in no cardiopulmonary distress.Ill appearing  HEENT: No facial asymmetry, EOMI, no sinus tenderness,  oropharynx pink and moist.  Neck supple no adenopathy.  Chest: decreased air entry, scattered crackles , no wheezes  CVS: S1, S2 no murmurs, no S3.  ABD: Soft non tender. Bowel sounds normal.  Ext: No edema  MS: Adequate ROM spine, shoulders, hips and knees.  Skin: Intact, no ulcerations or rash noted.  Psych: Good eye contact, normal affect. Memory intact not anxious or depressed appearing.  CNS: CN 2-12 intact, power, tone and sensation normal throughout.        Assessment & Plan:

## 2011-03-28 NOTE — Assessment & Plan Note (Signed)
toradol in the office tylenol every 6 hrs as needed

## 2011-03-28 NOTE — Assessment & Plan Note (Signed)
Rocephin in the office, tessalon perles, septra and phenergan dm prescribed, work excuse from today to return 04/01/2011

## 2011-03-28 NOTE — Assessment & Plan Note (Signed)
Uncontrolled, resume medication 

## 2011-03-28 NOTE — Patient Instructions (Signed)
CPE in 2 month  You are being treated for acute bronchitis with influenza.   You will get toradol in the office for headache, also rocephin for bronchitis. Antibiotics and other medication is sent to the pharmacy  Your blood pressure is high, medication is sent in that you need to resume   It is important that you exercise regularly at least 30 minutes 5 times a week. If you develop chest pain, have severe difficulty breathing, or feel very tired, stop exercising immediately and seek medical attention  A healthy diet is rich in fruit, vegetables and whole grains. Poultry fish, nuts and beans are a healthy choice for protein rather then red meat. A low sodium diet and drinking 64 ounces of water daily is generally recommended. Oils and sweet should be limited. Carbohydrates especially for those who are diabetic or overweight, should be limited to 30-45 gram per meal. It is important to eat on a regular schedule, at least 3 times daily. Snacks should be primarily fruits, vegetables or nuts.  Labs today.  Work excuse from today to return on 04/01/2011

## 2011-03-29 LAB — LIPID PANEL
Cholesterol: 194 mg/dL (ref 0–200)
HDL: 42 mg/dL (ref >39)
LDL Cholesterol: 115 mg/dL — ABNORMAL HIGH (ref 0–99)
Total CHOL/HDL Ratio: 4.6 Ratio
Triglycerides: 185 mg/dL — ABNORMAL HIGH (ref <150)
VLDL: 37 mg/dL (ref 0–40)

## 2011-03-29 LAB — CBC
HCT: 44.2 % (ref 39.0–52.0)
Hemoglobin: 14.5 g/dL (ref 13.0–17.0)
MCH: 27.4 pg (ref 26.0–34.0)
MCHC: 32.8 g/dL (ref 30.0–36.0)
MCV: 83.6 fL (ref 78.0–100.0)
Platelets: 129 10*3/uL — ABNORMAL LOW (ref 150–400)
RBC: 5.29 MIL/uL (ref 4.22–5.81)
RDW: 13.6 % (ref 11.5–15.5)
WBC: 7.1 10*3/uL (ref 4.0–10.5)

## 2011-03-29 LAB — COMPREHENSIVE METABOLIC PANEL
ALT: 27 U/L (ref 0–53)
AST: 26 U/L (ref 0–37)
Albumin: 4.2 g/dL (ref 3.5–5.2)
Alkaline Phosphatase: 59 U/L (ref 39–117)
BUN: 13 mg/dL (ref 6–23)
CO2: 22 mEq/L (ref 19–32)
Calcium: 9.3 mg/dL (ref 8.4–10.5)
Chloride: 101 mEq/L (ref 96–112)
Creat: 1.26 mg/dL (ref 0.50–1.35)
Glucose, Bld: 93 mg/dL (ref 70–99)
Potassium: 4 mEq/L (ref 3.5–5.3)
Sodium: 138 mEq/L (ref 135–145)
Total Bilirubin: 0.7 mg/dL (ref 0.3–1.2)
Total Protein: 7.5 g/dL (ref 6.0–8.3)

## 2011-03-29 LAB — PSA: PSA: 0.56 ng/mL (ref <=4.00)

## 2011-03-29 LAB — HEMOGLOBIN A1C
Hgb A1c MFr Bld: 6.8 % — ABNORMAL HIGH (ref <5.7)
Mean Plasma Glucose: 148 mg/dL — ABNORMAL HIGH (ref <117)

## 2011-03-29 LAB — TSH: TSH: 0.559 u[IU]/mL (ref 0.350–4.500)

## 2011-03-30 NOTE — Assessment & Plan Note (Signed)
Acute infection, decongestant, cough suppresant and antibiotic prescribed

## 2011-03-30 NOTE — Assessment & Plan Note (Signed)
Deteriorated. Patient re-educated about  the importance of commitment to a  minimum of 150 minutes of exercise per week. The importance of healthy food choices with portion control discussed. Encouraged to start a food diary, count calories and to consider  joining a support group. Sample diet sheets offered. Goals set by the patient for the next several months.    

## 2011-05-13 ENCOUNTER — Telehealth (HOSPITAL_COMMUNITY): Payer: Self-pay | Admitting: Dietician

## 2011-05-13 NOTE — Telephone Encounter (Signed)
Received referral from Dr. Anthony Sar office for dx: prediabetes.

## 2011-05-17 NOTE — Telephone Encounter (Signed)
Sent letter to pt home via [place] [employer] in attempt to contact pt for appointment.

## 2011-05-20 ENCOUNTER — Encounter: Payer: Self-pay | Admitting: Family Medicine

## 2011-05-22 ENCOUNTER — Ambulatory Visit (INDEPENDENT_AMBULATORY_CARE_PROVIDER_SITE_OTHER): Payer: PRIVATE HEALTH INSURANCE | Admitting: Family Medicine

## 2011-05-22 ENCOUNTER — Encounter: Payer: Self-pay | Admitting: Family Medicine

## 2011-05-22 VITALS — BP 130/82 | HR 110 | Resp 18 | Ht 69.5 in | Wt 260.1 lb

## 2011-05-22 DIAGNOSIS — R5381 Other malaise: Secondary | ICD-10-CM

## 2011-05-22 DIAGNOSIS — I10 Essential (primary) hypertension: Secondary | ICD-10-CM

## 2011-05-22 DIAGNOSIS — E669 Obesity, unspecified: Secondary | ICD-10-CM

## 2011-05-22 DIAGNOSIS — Z1211 Encounter for screening for malignant neoplasm of colon: Secondary | ICD-10-CM

## 2011-05-22 DIAGNOSIS — R7309 Other abnormal glucose: Secondary | ICD-10-CM

## 2011-05-22 DIAGNOSIS — Z13220 Encounter for screening for lipoid disorders: Secondary | ICD-10-CM

## 2011-05-22 DIAGNOSIS — R7301 Impaired fasting glucose: Secondary | ICD-10-CM

## 2011-05-22 DIAGNOSIS — N529 Male erectile dysfunction, unspecified: Secondary | ICD-10-CM

## 2011-05-22 DIAGNOSIS — R7303 Prediabetes: Secondary | ICD-10-CM

## 2011-05-22 LAB — POC HEMOCCULT BLD/STL (OFFICE/1-CARD/DIAGNOSTIC): Fecal Occult Blood, POC: NEGATIVE

## 2011-05-22 MED ORDER — TADALAFIL 10 MG PO TABS: ORAL_TABLET | ORAL | Status: DC

## 2011-05-22 NOTE — Patient Instructions (Addendum)
F/U end April.  You need to commit to daily exercise and weight loss by changing your diet.  You are pre diabetic  Pls call and attend free class at the hospital on appropriate food choices and eating habits, also you need to follow a low fat diet.  Fruit and Vegetable and water are all healthy.  Log into "my fitness pal" this will help  Fasting lipid, chem 7 and HBa1C inn April before f/u  Use cialis only when you plan to be sexually active, take one or two tablet 30 minutes before intercourse

## 2011-05-22 NOTE — Assessment & Plan Note (Signed)
Pt education done, he is also referred tom diabetic class, rept HBa1C and lipids in April

## 2011-05-23 NOTE — Telephone Encounter (Signed)
Appointment scheduled for 05/29/11 at 2 PM.

## 2011-05-25 NOTE — Assessment & Plan Note (Signed)
Worsened, problems wit attaining and maintaining erections, will try med

## 2011-05-25 NOTE — Assessment & Plan Note (Signed)
Deteriorated. Patient re-educated about  the importance of commitment to a  minimum of 150 minutes of exercise per week. The importance of healthy food choices with portion control discussed. Encouraged to start a food diary, count calories and to consider  joining a support group. Sample diet sheets offered. Goals set by the patient for the next several months.    

## 2011-05-25 NOTE — Progress Notes (Signed)
  Subjective:    Patient ID: Roger Prince, male    DOB: 1960-10-13, 51 y.o.   MRN: [id number]  HPI The PT is here for follow up and re-evaluation of chronic medical conditions, medication management and review of any available recent lab and radiology data.  Preventive health is updated, specifically  Cancer screening and Immunization.   Questions or concerns regarding consultations or procedures which the PT has had in the interim are  addressed. The PT denies any adverse reactions to current medications since the last visit.  C/o worsening ED wants help with medication. Is concerned about weight gain and deterioration in his health      Review of Systems See HPI Denies recent fever or chills. Denies sinus pressure, nasal congestion, ear pain or sore throat. Denies chest congestion, productive cough or wheezing. Denies chest pains, palpitations and leg swelling Denies abdominal pain, nausea, vomiting,diarrhea or constipation.   Denies dysuria, frequency, hesitancy or incontinence. Denies joint pain, swelling and limitation in mobility. Denies headaches, seizures, numbness, or tingling. Denies depression, anxiety or insomnia. Denies skin break down or rash.        Objective:   Physical Exam Pleasant well nourished male, alert and oriented x 3, in no cardio-pulmonary distress. Afebrile. HEENT No facial trauma or asymetry. Sinuses non tender. EOMI, PERTL, fundoscopic exam is negative for hemorhages or exudates. External ears normal, tympanic membranes clear. Oropharynx moist, no exudate, good dentition. Neck: supple, no adenopathy,JVD or thyromegaly.No bruits.  Chest: Clear to ascultation bilaterally.No crackles or wheezes. Non tender to palpation  Breast: No asymetry,no masses. No nipple discharge or inversion. No axillary or supraclavicular adenopathy  Cardiovascular system; Heart sounds normal,  S1 and  S2 ,no S3.  No murmur, or thrill. Apical beat not  displaced Peripheral pulses normal.  Abdomen: Soft, non tender, no organomegaly or masses. No bruits. Bowel sounds normal. No guarding, tenderness or rebound.  Rectal:  No mass. guaiac negative stool. Prostate smooth and firm  GU: Deferred  Musculoskeletal exam: Full ROM of spine, hips , shoulders and knees. No deformity ,swelling or crepitus noted. No muscle wasting or atrophy.   Neurologic: Cranial nerves 2 to 12 intact. Power, tone ,sensation and reflexes normal throughout. No disturbance in gait. No tremor.  Skin: Intact, no ulceration, erythema , scaling or rash noted. Pigmentation normal throughout  Psych; Normal mood and affect. Judgement and concentration normal         Assessment & Plan:

## 2011-05-25 NOTE — Assessment & Plan Note (Signed)
Controlled, no change in medication  

## 2011-05-29 ENCOUNTER — Encounter (HOSPITAL_COMMUNITY): Payer: Self-pay | Admitting: Dietician

## 2011-05-29 NOTE — Progress Notes (Signed)
Outpatient Initial Nutrition Assessment  Date:05/29/2011   Time: 2:00 PM  Referring Physician: Dr. Lodema Hong Reason for Visit: pre-diabetes  Nutrition Assessment:  Ht: 69.5" Wt: 256# IBW: 163# %IBW: 157% UBW: 225# %UBW: 138% BMI: 37.26 Goal Weight: 230# (10% weight loss) Weight hx: Pt reports his highest weight was at his last visit with Dr. Lodema Hong on 05/23/11 (264#). Pt reports he maintained a weight of 225# for most of his adult life up until 1-1.5 years ago, when he stopped exercising. Noted +31# (13.8%) weight gain in 1-1.5 years.   Estimated nutritional needs: 2431-2652 kcals daily, 93-116 grams protein daily, 2.4-2.7 L fluid daily  PMH: No past medical history on file.  Medications:  Current Outpatient Rx  Name Route Sig Dispense Refill  . TADALAFIL 10 MG PO TABS  One tablet once daily , for erectile dysfunction 30 tablet 1  . TRIAMTERENE-HCTZ 37.5-25 MG PO TABS Oral Take 1 each (1 tablet total) by mouth daily. 30 tablet 3    Labs:  CMP     Component Value Date/Time   NA 138 03/28/2011 1552   K 4.0 03/28/2011 1552   CL 101 03/28/2011 1552   CO2 22 03/28/2011 1552   GLUCOSE 93 03/28/2011 1552   BUN 13 03/28/2011 1552   CREATININE 1.26 03/28/2011 1552   CREATININE 1.19 11/21/2008 2025   CALCIUM 9.3 03/28/2011 1552   PROT 7.5 03/28/2011 1552   ALBUMIN 4.2 03/28/2011 1552   AST 26 03/28/2011 1552   ALT 27 03/28/2011 1552   ALKPHOS 59 03/28/2011 1552   BILITOT 0.7 03/28/2011 1552     Lipid Panel     Component Value Date/Time   CHOL 194 03/28/2011 1552   TRIG 185* 03/28/2011 1552   HDL 42 03/28/2011 1552   CHOLHDL 4.6 03/28/2011 1552   VLDL 37 03/28/2011 1552   LDLCALC 115* 03/28/2011 1552     Lab Results  Component Value Date   HGBA1C 6.8* 03/28/2011   Lab Results  Component Value Date   LDLCALC 115* 03/28/2011   CREATININE 1.26 03/28/2011     Nutrition hx/habits: Roger Prince is a very pleasant gentleman who would like to improve his Hgb A1c, lose weight,  and delay onset of diabetes. 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 He lives in [place] with his wife (who is a Runner, broadcasting/film/video) and 3 children (age 23, 2, and 62). He reports he has gained weight due to stopping exercising about 1-1.5 years ago due to a knee injury. 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 His stress level is reported at 5, citing work, life, and family balance with his various commitments. He reports he has started walking the track and his goal is to walk 2-4 miles 3 times per week. He has stopped drinking soda and juice. He drinks mostly water.   Diet recall: Breakfast (7:30-8 AM): fruit, boiled egg, whole wheat muffin; Lunch: skip; Dinner: grilled chicken salad; Snack: fruit  Nutrition Diagnosis: Inconsistent carbohydrate intake r/t disordered eating pattern AEB Hgb A1c: 6.8, pt regularly skips lunch.   Nutrition Intervention: Nutrition rx: 2000 kcal NAS, diabetic diet; 3 meals per day; 1 HS snack; limit 1 starch per meal; 2.5 hours physical activity per week  Education/Counseling Provided: Educated pt on diabetic diet principles. Emphasized portion control, sources of carbohydrate, and plate  method. Discussed importance of eating 3 meals per day to improve glycemic control. Discussed healthy cooking methods and food choices. Discussed importance of regular physical activity to assist with weight loss. Also demonstrated functionality of MyFitnessPal app.  Understanding, Motivation, Ability to Follow Recommendations: Expect good compliance.   Monitoring and Evaluation: Goals: 1) 1-2# weight loss per week; 2) 2.5 hours physical activity per week; 3) Hgb A1c < 6.5  Recommendations: 1) For weight loss: 1931-2152 kcals daily; 2) Break exercise up into smaller, more frequent intervals- start slow; 3) Keep food record, ex.  MyFitnessPal  F/U: PRN. Provided RD contact information.   Anwar, Sakata, RD  05/29/2011  Time: 2:00 PM

## 2011-07-11 ENCOUNTER — Telehealth: Payer: Self-pay | Admitting: Family Medicine

## 2011-07-11 DIAGNOSIS — I10 Essential (primary) hypertension: Secondary | ICD-10-CM

## 2011-07-12 MED ORDER — TRIAMTERENE-HCTZ 37.5-25 MG PO TABS: 1.0000 | ORAL_TABLET | Freq: Every day | ORAL | Status: DC

## 2011-07-12 NOTE — Telephone Encounter (Signed)
Refill 1 ninety day supply only, has appt 4/30 please

## 2011-07-12 NOTE — Telephone Encounter (Signed)
Refilled x1 

## 2011-08-27 ENCOUNTER — Ambulatory Visit: Payer: PRIVATE HEALTH INSURANCE | Admitting: Family Medicine

## 2012-01-02 ENCOUNTER — Ambulatory Visit: Payer: PRIVATE HEALTH INSURANCE | Admitting: Family Medicine

## 2012-01-07 ENCOUNTER — Encounter: Payer: Self-pay | Admitting: Family Medicine

## 2012-01-07 ENCOUNTER — Ambulatory Visit (INDEPENDENT_AMBULATORY_CARE_PROVIDER_SITE_OTHER): Payer: 59 | Admitting: Family Medicine

## 2012-01-07 VITALS — BP 130/92 | HR 106 | Resp 15 | Ht 69.5 in | Wt 262.0 lb

## 2012-01-07 DIAGNOSIS — E669 Obesity, unspecified: Secondary | ICD-10-CM

## 2012-01-07 DIAGNOSIS — E119 Type 2 diabetes mellitus without complications: Secondary | ICD-10-CM

## 2012-01-07 DIAGNOSIS — Z23 Encounter for immunization: Secondary | ICD-10-CM

## 2012-01-07 DIAGNOSIS — Z1211 Encounter for screening for malignant neoplasm of colon: Secondary | ICD-10-CM

## 2012-01-07 DIAGNOSIS — R5383 Other fatigue: Secondary | ICD-10-CM

## 2012-01-07 DIAGNOSIS — R5381 Other malaise: Secondary | ICD-10-CM

## 2012-01-07 DIAGNOSIS — R7303 Prediabetes: Secondary | ICD-10-CM

## 2012-01-07 DIAGNOSIS — E785 Hyperlipidemia, unspecified: Secondary | ICD-10-CM

## 2012-01-07 DIAGNOSIS — R7309 Other abnormal glucose: Secondary | ICD-10-CM

## 2012-01-07 DIAGNOSIS — I10 Essential (primary) hypertension: Secondary | ICD-10-CM

## 2012-01-07 DIAGNOSIS — N529 Male erectile dysfunction, unspecified: Secondary | ICD-10-CM

## 2012-01-07 MED ORDER — TADALAFIL 10 MG PO TABS: 10.0000 mg | ORAL_TABLET | ORAL | Status: DC | PRN

## 2012-01-07 MED ORDER — TRIAMTERENE-HCTZ 37.5-25 MG PO TABS: ORAL_TABLET | ORAL | Status: DC

## 2012-01-07 NOTE — Patient Instructions (Addendum)
F/u in November.  Dose increase in blood pressure medicine one and a half once daily, blood pressure is high.  It is important that you exercise regularly at least 30 minutes 5 times a week. If you develop chest pain, have severe difficulty breathing, or feel very tired, stop exercising immediately and seek medical attention   A healthy diet is rich in fruit, vegetables and whole grains. Poultry fish, nuts and beans are a healthy choice for protein rather then red meat. A low sodium diet and drinking 64 ounces of water daily is generally recommended. Oils and sweet should be limited. Carbohydrates especially for those who are diabetic or overweight, should be limited to 34-45 gram per meal. It is important to eat on a regular schedule, at least 3 times daily. Snacks should be primarily fruits, vegetables or nuts.   Flu vaccine today.  You will get information on prediabetes, you need to change lifestyle and lose weight  You are referred for colonoscopy, Dr Patty Sermons office will call you.  Fasting lipid , cmp and HBa1C tomorrow

## 2012-01-08 DIAGNOSIS — E119 Type 2 diabetes mellitus without complications: Secondary | ICD-10-CM | POA: Insufficient documentation

## 2012-01-08 DIAGNOSIS — E785 Hyperlipidemia, unspecified: Secondary | ICD-10-CM | POA: Insufficient documentation

## 2012-01-08 LAB — COMPREHENSIVE METABOLIC PANEL
ALT: 21 U/L (ref 0–53)
AST: 25 U/L (ref 0–37)
Albumin: 4.5 g/dL (ref 3.5–5.2)
Alkaline Phosphatase: 68 U/L (ref 39–117)
BUN: 20 mg/dL (ref 6–23)
CO2: 27 mEq/L (ref 19–32)
Calcium: 10.4 mg/dL (ref 8.4–10.5)
Chloride: 97 mEq/L (ref 96–112)
Creat: 1.15 mg/dL (ref 0.50–1.35)
Glucose, Bld: 114 mg/dL — ABNORMAL HIGH (ref 70–99)
Potassium: 4.5 mEq/L (ref 3.5–5.3)
Sodium: 136 mEq/L (ref 135–145)
Total Bilirubin: 0.7 mg/dL (ref 0.3–1.2)
Total Protein: 8 g/dL (ref 6.0–8.3)

## 2012-01-08 LAB — LIPID PANEL
Cholesterol: 226 mg/dL — ABNORMAL HIGH (ref 0–200)
HDL: 43 mg/dL (ref >39)
LDL Cholesterol: 137 mg/dL — ABNORMAL HIGH (ref 0–99)
Total CHOL/HDL Ratio: 5.3 Ratio
Triglycerides: 228 mg/dL — ABNORMAL HIGH (ref <150)
VLDL: 46 mg/dL — ABNORMAL HIGH (ref 0–40)

## 2012-01-08 LAB — HEMOGLOBIN A1C
Hgb A1c MFr Bld: 6.7 % — ABNORMAL HIGH (ref <5.7)
Mean Plasma Glucose: 146 mg/dL — ABNORMAL HIGH (ref <117)

## 2012-01-08 NOTE — Assessment & Plan Note (Signed)
Aggressive lifestyle modification with weight loss, start metformin twice daily

## 2012-01-13 ENCOUNTER — Encounter (INDEPENDENT_AMBULATORY_CARE_PROVIDER_SITE_OTHER): Payer: Self-pay | Admitting: *Deleted

## 2012-01-13 NOTE — Assessment & Plan Note (Signed)
Deteriorated. Patient re-educated about  the importance of commitment to a  minimum of 150 minutes of exercise per week. The importance of healthy food choices with portion control discussed. Encouraged to start a food diary, count calories and to consider  joining a support group. Sample diet sheets offered. Goals set by the patient for the next several months.    

## 2012-01-13 NOTE — Assessment & Plan Note (Signed)
Unchanged,  Patient educated about the importance of limiting  Carbohydrate intake , the need to commit to daily physical activity for a minimum of 30 minutes , and to commit weight loss. The fact that changes in all these areas will reduce or eliminate all together the development of diabetes is stressed.    

## 2012-01-13 NOTE — Progress Notes (Signed)
  Subjective:    Patient ID: Roger Prince, male    DOB: 05-18-1960, 51 y.o.   MRN: [id number]  HPI The PT is here for follow up and re-evaluation of chronic medical conditions, medication management and review of any available recent lab and radiology data.  Preventive health is updated, specifically  Cancer screening and Immunization.    The PT denies any adverse reactions to current medications since the last visit.  There are no new concerns. Has gained weight and has not been exercising There are no specific complaints       Review of Systems See HPI Denies recent fever or chills. Denies sinus pressure, nasal congestion, ear pain or sore throat. Denies chest congestion, productive cough or wheezing. Denies chest pains, palpitations and leg swelling Denies abdominal pain, nausea, vomiting,diarrhea or constipation.   Denies dysuria, frequency, hesitancy or incontinence. Denies joint pain, swelling and limitation in mobility. Denies headaches, seizures, numbness, or tingling. Denies depression, anxiety or insomnia. Denies skin break down or rash.        Objective:   Physical Exam Patient alert and oriented and in no cardiopulmonary distress.  HEENT: No facial asymmetry, EOMI, no sinus tenderness,  oropharynx pink and moist.  Neck supple no adenopathy.  Chest: Clear to auscultation bilaterally.  CVS: S1, S2 no murmurs, no S3.  ABD: Soft non tender. Bowel sounds normal.  Ext: No edema  MS: Adequate ROM spine, shoulders, hips and knees.  Skin: Intact, no ulcerations or rash noted.  Psych: Good eye contact, normal affect. Memory intact not anxious or depressed appearing.  CNS: CN 2-12 intact, power, tone and sensation normal throughout.        Assessment & Plan:

## 2012-01-13 NOTE — Assessment & Plan Note (Signed)
Hyperlipidemia:Low fat diet discussed and encouraged.  Pt to start lipid lowering meds

## 2012-01-13 NOTE — Assessment & Plan Note (Signed)
Pt to continue Ed medication.Advised  That improvement in blood sugar and blood pressure will improve erectile function

## 2012-01-13 NOTE — Assessment & Plan Note (Signed)
Uncontrolled, dose increase on medication DASH diet and commitment to daily physical activity for a minimum of 30 minutes discussed and encouraged, as a part of hypertension management. The importance of attaining a healthy weight is also discussed.

## 2012-01-15 ENCOUNTER — Encounter (INDEPENDENT_AMBULATORY_CARE_PROVIDER_SITE_OTHER): Payer: Self-pay | Admitting: *Deleted

## 2012-01-15 ENCOUNTER — Other Ambulatory Visit (INDEPENDENT_AMBULATORY_CARE_PROVIDER_SITE_OTHER): Payer: Self-pay | Admitting: *Deleted

## 2012-01-15 ENCOUNTER — Telehealth (INDEPENDENT_AMBULATORY_CARE_PROVIDER_SITE_OTHER): Payer: Self-pay | Admitting: *Deleted

## 2012-01-15 DIAGNOSIS — Z1211 Encounter for screening for malignant neoplasm of colon: Secondary | ICD-10-CM

## 2012-01-15 NOTE — Telephone Encounter (Signed)
Patient needs movi prep 

## 2012-01-16 MED ORDER — PEG-KCL-NACL-NASULF-NA ASC-C 100 G PO SOLR: 1.0000 | Freq: Once | ORAL | Status: DC

## 2012-02-04 ENCOUNTER — Other Ambulatory Visit: Payer: Self-pay

## 2012-02-04 DIAGNOSIS — E119 Type 2 diabetes mellitus without complications: Secondary | ICD-10-CM

## 2012-02-04 DIAGNOSIS — E785 Hyperlipidemia, unspecified: Secondary | ICD-10-CM

## 2012-02-04 DIAGNOSIS — I10 Essential (primary) hypertension: Secondary | ICD-10-CM

## 2012-02-04 MED ORDER — ROSUVASTATIN CALCIUM 10 MG PO TABS: 10.0000 mg | ORAL_TABLET | Freq: Every day | ORAL | Status: DC

## 2012-02-04 MED ORDER — METFORMIN HCL 500 MG PO TABS: 500.0000 mg | ORAL_TABLET | Freq: Two times a day (BID) | ORAL | Status: DC

## 2012-02-07 ENCOUNTER — Telehealth: Payer: Self-pay | Admitting: Family Medicine

## 2012-02-07 ENCOUNTER — Other Ambulatory Visit: Payer: Self-pay

## 2012-02-07 MED ORDER — GLUCOSE BLOOD VI STRP: ORAL_STRIP | Status: DC

## 2012-02-07 MED ORDER — ONETOUCH ULTRASOFT LANCETS MISC: Status: DC

## 2012-02-07 NOTE — Telephone Encounter (Signed)
Test strips and lancets sent

## 2012-02-18 ENCOUNTER — Telehealth (INDEPENDENT_AMBULATORY_CARE_PROVIDER_SITE_OTHER): Payer: Self-pay | Admitting: *Deleted

## 2012-02-18 NOTE — Telephone Encounter (Signed)
PCP/Requesting MD: simpson  Name & DOB: Roger Prince 12/06/1960   Procedure: tcs  Reason/Indication:  screening  Has patient had this procedure before?  no  If so, when, by whom and where?    Is there a family history of colon cancer?  no  Who?  What age when diagnosed?    Is patient diabetic?   no      Does patient have prosthetic heart valve?  no  Do you have a pacemaker?  no  Has patient had joint replacement within last 12 months?  no  Is patient on Coumadin, Plavix and/or Aspirin? no  Medications: triamterene/hctz 37.5/25 mg daily  Allergies: demerol  Medication Adjustment:   Procedure date & time: 03/12/12 at 830

## 2012-02-20 NOTE — Telephone Encounter (Signed)
agree

## 2012-03-12 ENCOUNTER — Ambulatory Visit: Admit: 2012-03-12 | Payer: Self-pay | Admitting: Internal Medicine

## 2012-03-12 SURGERY — COLONOSCOPY
Anesthesia: Moderate Sedation

## 2012-04-16 ENCOUNTER — Ambulatory Visit: Payer: 59 | Admitting: Family Medicine

## 2012-05-08 ENCOUNTER — Telehealth: Payer: Self-pay | Admitting: Family Medicine

## 2012-05-08 ENCOUNTER — Other Ambulatory Visit: Payer: Self-pay

## 2012-05-08 DIAGNOSIS — I10 Essential (primary) hypertension: Secondary | ICD-10-CM

## 2012-05-08 DIAGNOSIS — E119 Type 2 diabetes mellitus without complications: Secondary | ICD-10-CM

## 2012-05-08 MED ORDER — METFORMIN HCL 500 MG PO TABS: 500.0000 mg | ORAL_TABLET | Freq: Two times a day (BID) | ORAL | Status: DC

## 2012-05-08 MED ORDER — TRIAMTERENE-HCTZ 37.5-25 MG PO TABS: ORAL_TABLET | ORAL | Status: DC

## 2012-05-08 NOTE — Telephone Encounter (Signed)
meds refilled 

## 2012-05-11 ENCOUNTER — Telehealth: Payer: Self-pay | Admitting: Family Medicine

## 2012-05-11 DIAGNOSIS — E119 Type 2 diabetes mellitus without complications: Secondary | ICD-10-CM

## 2012-05-11 DIAGNOSIS — I10 Essential (primary) hypertension: Secondary | ICD-10-CM

## 2012-05-11 MED ORDER — METFORMIN HCL 500 MG PO TABS: 500.0000 mg | ORAL_TABLET | Freq: Two times a day (BID) | ORAL | Status: DC

## 2012-05-11 MED ORDER — TRIAMTERENE-HCTZ 37.5-25 MG PO TABS: ORAL_TABLET | ORAL | Status: DC

## 2012-05-11 NOTE — Telephone Encounter (Signed)
meds refilled 

## 2012-05-11 NOTE — Telephone Encounter (Signed)
Sent in

## 2012-06-02 ENCOUNTER — Ambulatory Visit: Payer: 59 | Admitting: Family Medicine

## 2012-06-19 ENCOUNTER — Ambulatory Visit: Payer: Self-pay | Admitting: Family Medicine

## 2012-06-22 ENCOUNTER — Ambulatory Visit: Payer: Self-pay | Admitting: Family Medicine

## 2012-07-20 ENCOUNTER — Ambulatory Visit (INDEPENDENT_AMBULATORY_CARE_PROVIDER_SITE_OTHER): Payer: Self-pay | Admitting: Family Medicine

## 2012-07-20 ENCOUNTER — Encounter: Payer: Self-pay | Admitting: Family Medicine

## 2012-07-20 VITALS — BP 160/98 | HR 80 | Resp 16 | Ht 69.5 in | Wt 253.0 lb

## 2012-07-20 DIAGNOSIS — E669 Obesity, unspecified: Secondary | ICD-10-CM

## 2012-07-20 DIAGNOSIS — R5383 Other fatigue: Secondary | ICD-10-CM

## 2012-07-20 DIAGNOSIS — I10 Essential (primary) hypertension: Secondary | ICD-10-CM

## 2012-07-20 DIAGNOSIS — E119 Type 2 diabetes mellitus without complications: Secondary | ICD-10-CM

## 2012-07-20 DIAGNOSIS — R5381 Other malaise: Secondary | ICD-10-CM

## 2012-07-20 DIAGNOSIS — Z125 Encounter for screening for malignant neoplasm of prostate: Secondary | ICD-10-CM

## 2012-07-20 DIAGNOSIS — E785 Hyperlipidemia, unspecified: Secondary | ICD-10-CM

## 2012-07-20 MED ORDER — TRIAMTERENE-HCTZ 37.5-25 MG PO TABS: ORAL_TABLET | ORAL | Status: DC

## 2012-07-20 MED ORDER — METFORMIN HCL 500 MG PO TABS: ORAL_TABLET | ORAL | Status: DC

## 2012-07-20 MED ORDER — PRAVASTATIN SODIUM 40 MG PO TABS: 40.0000 mg | ORAL_TABLET | Freq: Every day | ORAL | Status: DC

## 2012-07-20 NOTE — Progress Notes (Signed)
  Subjective:    Patient ID: Roger Prince, male    DOB: 1960/09/17, 52 y.o.   MRN: [id number]  HPI The PT is here for follow up and re-evaluation of chronic medical conditions, medication management and review of any available recent lab and radiology data.  Preventive health is updated, specifically  Cancer screening and Immunization. Needs colonoscopy, trying to get insurance  Questions or concerns regarding consultations or procedures which the PT has had in the interim are  addressed. The PT denies any adverse reactions to current medications since the last visit.  There are no new concerns.  There are no specific complaints       Review of Systems    See HPI Denies recent fever or chills. Denies sinus pressure, nasal congestion, ear pain or sore throat. Denies chest congestion, productive cough or wheezing. Denies chest pains, palpitations and leg swelling Denies abdominal pain, nausea, vomiting,diarrhea or constipation.   Denies dysuria, frequency, hesitancy or incontinence. Denies joint pain, swelling and limitation in mobility. Denies headaches, seizures, numbness, or tingling. Denies depression, anxiety or insomnia. Denies skin break down or rash.     Objective:   Physical Exam  Patient alert and oriented and in no cardiopulmonary distress.  HEENT: No facial asymmetry, EOMI, no sinus tenderness,  oropharynx pink and moist.  Neck supple no adenopathy.  Chest: Clear to auscultation bilaterally.  CVS: S1, S2 no murmurs, no S3.  ABD: Soft non tender. Bowel sounds normal.  Ext: No edema  MS: Adequate ROM spine, shoulders, hips and knees.  Skin: Intact, no ulcerations or rash noted.  Psych: Good eye contact, normal affect. Memory intact not anxious or depressed appearing.  CNS: CN 2-12 intact, power, tone and sensation normal throughout.       Assessment & Plan:

## 2012-07-20 NOTE — Patient Instructions (Addendum)
F/u in 5 months and 3 weeks, call if you need me before  It is important that you exercise regularly at least 30 minutes 7 times a week. If you develop chest pain, have severe difficulty breathing, or feel very tired, stop exercising immediately and seek medical attention   A healthy diet is rich in fruit, vegetables and whole grains. Poultry fish, nuts and beans are a healthy choice for protein rather then red meat. A low sodium diet and drinking 64 ounces of water daily is generally recommended. Oils and sweet should be limited. Carbohydrates especially for those who are diabetic or overweight, should be limited to 30-45 gram per meal. It is important to eat on a regular schedule, at least 3 times daily. Snacks should be primarily fruits, vegetables or nuts.  Please het HBA1C and chem 7 this week if possible  Fasting lipid, cmp and eGFr, hBA1C, CBC and PSA in 6 month before next visit  New med pravachol ofr cholesterol, continue blood pressure and diabetes med as before  OK to take metformin TWO tabs at breakfast together

## 2012-07-20 NOTE — Assessment & Plan Note (Signed)
Non compliant with crestor, cost , a major factor, also c/o stomach pain. Will start pravachol  Hyperlipidemia:Low fat diet discussed and encouraged.

## 2012-07-20 NOTE — Assessment & Plan Note (Signed)
Improved. Pt applauded on succesful weight loss through lifestyle change, and encouraged to continue same. Weight loss goal set for the next several months.  

## 2012-07-20 NOTE — Assessment & Plan Note (Signed)
Patient advised to reduce carb and sweets, commit to regular physical activity, take meds as prescribed, test blood as directed, and attempt to lose weight, to improve blood sugar control. Updated lab needed. Meter today shows fasting around 120's

## 2012-07-20 NOTE — Assessment & Plan Note (Signed)
Uncontrolled, out of med x  2 weeks DASH diet and commitment to daily physical activity for a minimum of 30 minutes discussed and encouraged, as a part of hypertension management. The importance of attaining a healthy weight is also discussed.

## 2012-10-30 ENCOUNTER — Other Ambulatory Visit: Payer: Self-pay | Admitting: Family Medicine

## 2013-01-14 ENCOUNTER — Ambulatory Visit: Payer: Self-pay | Admitting: Family Medicine

## 2013-03-02 ENCOUNTER — Ambulatory Visit: Payer: Self-pay | Admitting: Family Medicine

## 2013-03-16 ENCOUNTER — Other Ambulatory Visit: Payer: Self-pay | Admitting: Family Medicine

## 2013-03-16 ENCOUNTER — Ambulatory Visit (INDEPENDENT_AMBULATORY_CARE_PROVIDER_SITE_OTHER): Payer: BC Managed Care – PPO | Admitting: Family Medicine

## 2013-03-16 ENCOUNTER — Encounter (INDEPENDENT_AMBULATORY_CARE_PROVIDER_SITE_OTHER): Payer: Self-pay

## 2013-03-16 ENCOUNTER — Encounter: Payer: Self-pay | Admitting: Family Medicine

## 2013-03-16 VITALS — BP 140/90 | HR 100 | Resp 18 | Ht 69.5 in | Wt 264.1 lb

## 2013-03-16 DIAGNOSIS — B369 Superficial mycosis, unspecified: Secondary | ICD-10-CM

## 2013-03-16 DIAGNOSIS — E669 Obesity, unspecified: Secondary | ICD-10-CM

## 2013-03-16 DIAGNOSIS — E785 Hyperlipidemia, unspecified: Secondary | ICD-10-CM

## 2013-03-16 DIAGNOSIS — E119 Type 2 diabetes mellitus without complications: Secondary | ICD-10-CM

## 2013-03-16 DIAGNOSIS — Z23 Encounter for immunization: Secondary | ICD-10-CM

## 2013-03-16 DIAGNOSIS — I10 Essential (primary) hypertension: Secondary | ICD-10-CM

## 2013-03-16 LAB — CBC WITH DIFFERENTIAL/PLATELET
Basophils Absolute: 0 10*3/uL (ref 0.0–0.1)
Basophils Relative: 0 % (ref 0–1)
Eosinophils Absolute: 0.1 10*3/uL (ref 0.0–0.7)
Eosinophils Relative: 1 % (ref 0–5)
HCT: 43.5 % (ref 39.0–52.0)
Hemoglobin: 15.1 g/dL (ref 13.0–17.0)
Lymphocytes Relative: 53 % — ABNORMAL HIGH (ref 12–46)
Lymphs Abs: 3.9 10*3/uL (ref 0.7–4.0)
MCH: 27.9 pg (ref 26.0–34.0)
MCHC: 34.7 g/dL (ref 30.0–36.0)
MCV: 80.4 fL (ref 78.0–100.0)
Monocytes Absolute: 0.4 10*3/uL (ref 0.1–1.0)
Monocytes Relative: 6 % (ref 3–12)
Neutro Abs: 2.9 10*3/uL (ref 1.7–7.7)
Neutrophils Relative %: 40 % — ABNORMAL LOW (ref 43–77)
Platelets: 172 10*3/uL (ref 150–400)
RBC: 5.41 MIL/uL (ref 4.22–5.81)
RDW: 14.7 % (ref 11.5–15.5)
WBC: 7.3 10*3/uL (ref 4.0–10.5)

## 2013-03-16 LAB — LIPID PANEL
Cholesterol: 206 mg/dL — ABNORMAL HIGH (ref 0–200)
HDL: 39 mg/dL — ABNORMAL LOW (ref >39)
LDL Cholesterol: 126 mg/dL — ABNORMAL HIGH (ref 0–99)
Total CHOL/HDL Ratio: 5.3 Ratio
Triglycerides: 203 mg/dL — ABNORMAL HIGH (ref <150)
VLDL: 41 mg/dL — ABNORMAL HIGH (ref 0–40)

## 2013-03-16 LAB — PSA: PSA: 0.67 ng/mL (ref <=4.00)

## 2013-03-16 LAB — COMPLETE METABOLIC PANEL WITH GFR
ALT: 21 U/L (ref 0–53)
AST: 22 U/L (ref 0–37)
Albumin: 4.1 g/dL (ref 3.5–5.2)
Alkaline Phosphatase: 56 U/L (ref 39–117)
BUN: 10 mg/dL (ref 6–23)
CO2: 26 mEq/L (ref 19–32)
Calcium: 9.7 mg/dL (ref 8.4–10.5)
Chloride: 103 mEq/L (ref 96–112)
Creat: 1.13 mg/dL (ref 0.50–1.35)
GFR, Est African American: 87 mL/min
GFR, Est Non African American: 75 mL/min
Glucose, Bld: 102 mg/dL — ABNORMAL HIGH (ref 70–99)
Potassium: 4.5 mEq/L (ref 3.5–5.3)
Sodium: 140 mEq/L (ref 135–145)
Total Bilirubin: 0.4 mg/dL (ref 0.3–1.2)
Total Protein: 7 g/dL (ref 6.0–8.3)

## 2013-03-16 LAB — HEMOGLOBIN A1C
Hgb A1c MFr Bld: 6.7 % — ABNORMAL HIGH (ref <5.7)
Mean Plasma Glucose: 146 mg/dL — ABNORMAL HIGH (ref <117)

## 2013-03-16 MED ORDER — CLOTRIMAZOLE-BETAMETHASONE 1-0.05 % EX CREA: 1.0000 "application " | TOPICAL_CREAM | Freq: Two times a day (BID) | CUTANEOUS | Status: DC

## 2013-03-16 MED ORDER — BENAZEPRIL HCL 20 MG PO TABS: 20.0000 mg | ORAL_TABLET | Freq: Every day | ORAL | Status: DC

## 2013-03-16 MED ORDER — TRIAMTERENE-HCTZ 37.5-25 MG PO TABS: 1.0000 | ORAL_TABLET | Freq: Every day | ORAL | Status: DC

## 2013-03-16 NOTE — Patient Instructions (Addendum)
F/u in 3.5 month, call if you need me before Flu vaccine and pneumonia vaccine today  Microalb from office today    New medication doses  For blood pressure. REDUCE maxzide 25 mg to ONE daily, (do not try to break in half) Benazepril 20mg  one daily, additional medication for your blood pressure and kidney protection as a diabetic, one daily  You will get a message re labs and medication for blood sugar and cholesterol. PLEASE call the office with any questions   Based on blood sugar numbers you report , you will get a coupon for janumet instead of the metformin. No script is sent in today, this will be sent tomorrow based on labs  You will need to be on cholesterol medication, if there is a cost issue we need to know  Antifungal cream to be applied to area on left  leg twice daily for 2 weeks then as needed (clotrimazole betamethasone) It is important that you exercise regularly at least 30 minutes 5 times a week. If you develop chest pain, have severe difficulty breathing, or feel very tired, stop exercising immediately and seek medical attention   A healthy diet is rich in fruit, vegetables and whole grains. Poultry fish, nuts and beans are a healthy choice for protein rather then red meat. A low sodium diet and drinking 64 ounces of water daily is generally recommended. Oils and sweet should be limited. Carbohydrates especially for those who are diabetic or overweight, should be limited to 60-45 gram per meal. It is important to eat on a regular schedule, at least 3 times daily. Snacks should be primarily fruits, vegetables or nuts.  Weight loss goal of 3 pounds per month  HBA1C, cmp and EGFR , fasting lipid in 3.5 month, before f/u

## 2013-03-16 NOTE — Progress Notes (Signed)
  Subjective:    Patient ID: Roger Prince, male    DOB: 1960-12-08, 52 y.o.   MRN: [id number]  HPI The PT is here for follow up and re-evaluation of chronic medical conditions, medication management and review of any available recent lab and radiology data.  Preventive health is updated, specifically  Cancer screening and Immunization.   Questions or concerns regarding consultations or procedures which the PT has had in the interim are  addressed. The PT denies any adverse reactions to current medications since the last visit.  There are no new concerns. Is concerned about weight, wants to work consistently at this with behavioral change There are no specific complaints       Review of Systems See HPI Denies recent fever or chills. Denies sinus pressure, nasal congestion, ear pain or sore throat. Denies chest congestion, productive cough or wheezing. Denies chest pains, palpitations and leg swelling Denies abdominal pain, nausea, vomiting,diarrhea or constipation.   Denies dysuria, frequency, hesitancy or incontinence. Denies joint pain, swelling and limitation in mobility. Denies headaches, seizures, numbness, or tingling. Denies depression, anxiety or insomnia. Denies skin break down or rash.        Objective:   Physical Exam  Patient alert and oriented and in no cardiopulmonary distress.  HEENT: No facial asymmetry, EOMI, no sinus tenderness,  oropharynx pink and moist.  Neck supple no adenopathy.  Chest: Clear to auscultation bilaterally.  CVS: S1, S2 no murmurs, no S3.  ABD: Soft non tender. Bowel sounds normal.  Ext: No edema  MS: Adequate ROM spine, shoulders, hips and knees.  Skin: Intact, no ulcerations or rash noted.  Psych: Good eye contact, normal affect. Memory intact not anxious or depressed appearing.  CNS: CN 2-12 intact, power, tone and sensation normal throughout.       Assessment & Plan:

## 2013-03-17 ENCOUNTER — Encounter: Payer: Self-pay | Admitting: Family Medicine

## 2013-03-17 ENCOUNTER — Other Ambulatory Visit: Payer: Self-pay | Admitting: Family Medicine

## 2013-03-17 LAB — MICROALBUMIN / CREATININE URINE RATIO
Creatinine, Urine: 148.5 mg/dL
Microalb Creat Ratio: 3.4 mg/g (ref 0.0–30.0)
Microalb, Ur: 0.5 mg/dL (ref 0.00–1.89)

## 2013-03-17 MED ORDER — LOVASTATIN 20 MG PO TABS: 20.0000 mg | ORAL_TABLET | Freq: Every day | ORAL | Status: DC

## 2013-03-17 MED ORDER — SITAGLIP PHOS-METFORMIN HCL ER 100-1000 MG PO TB24: 1.0000 | ORAL_TABLET | Freq: Every day | ORAL | Status: DC

## 2013-03-18 ENCOUNTER — Encounter: Payer: Self-pay | Admitting: Family Medicine

## 2013-03-19 ENCOUNTER — Telehealth: Payer: Self-pay | Admitting: Family Medicine

## 2013-03-19 ENCOUNTER — Other Ambulatory Visit: Payer: Self-pay | Admitting: Family Medicine

## 2013-03-19 NOTE — Telephone Encounter (Signed)
Pls call pt and clarify the diabetes med. He sent me an e mail. The janumet is $100, not sure if with coupon, but even if no coupon, simplest thing to do now is to stop the janumet, increase the metformin from 1500mg  daily, was taking 3 three 500mg  tabs total, to 2000mg  daily, new script entered for 1000mg  twice daily, pls fax and notify pharmacy after you speak with him, any question , pls ask

## 2013-03-19 NOTE — Telephone Encounter (Signed)
Called patient and left message for them to return call at the office   

## 2013-03-19 NOTE — Telephone Encounter (Signed)
Called and left message for patient as well as sent mychart message.

## 2013-03-21 NOTE — Assessment & Plan Note (Signed)
Deteriorated. Patient re-educated about  the importance of commitment to a  minimum of 150 minutes of exercise per week. The importance of healthy food choices with portion control discussed. Encouraged to start a food diary, count calories and to consider  joining a support group. Sample diet sheets offered. Goals set by the patient for the next several months.    

## 2013-03-21 NOTE — Assessment & Plan Note (Signed)
Increase dose of metformin Patient advised to reduce carb and sweets, commit to regular physical activity, take meds as prescribed, test blood as directed, and attempt to lose weight, to improve blood sugar control.

## 2013-03-21 NOTE — Assessment & Plan Note (Signed)
slightly improved but still uncontrolled Pt re educated re the need to comply with statin, also low fat diet discussed

## 2013-03-21 NOTE — Assessment & Plan Note (Signed)
Uncontrolled, add statin and decrease dose of maxzide DASH diet and commitment to daily physical activity for a minimum of 30 minutes discussed and encouraged, as a part of hypertension management. The importance of attaining a healthy weight is also discussed.

## 2013-03-22 NOTE — Telephone Encounter (Signed)
Called patient and left message for them to return call at the office   

## 2013-03-29 ENCOUNTER — Other Ambulatory Visit: Payer: Self-pay

## 2013-03-29 MED ORDER — METFORMIN HCL 1000 MG PO TABS: 1000.0000 mg | ORAL_TABLET | Freq: Two times a day (BID) | ORAL | Status: DC

## 2013-03-29 NOTE — Telephone Encounter (Signed)
Patients wife aware and will send in rx

## 2013-04-29 ENCOUNTER — Encounter (HOSPITAL_COMMUNITY): Payer: Self-pay | Admitting: Emergency Medicine

## 2013-04-29 ENCOUNTER — Emergency Department (HOSPITAL_COMMUNITY)
Admission: EM | Admit: 2013-04-29 | Discharge: 2013-04-30 | Disposition: A | Discharge status: Home / Self Care | Payer: BC Managed Care – PPO | Attending: Emergency Medicine | Admitting: Emergency Medicine

## 2013-04-29 ENCOUNTER — Emergency Department (HOSPITAL_COMMUNITY): Payer: BC Managed Care – PPO

## 2013-04-29 DIAGNOSIS — S335XXA Sprain of ligaments of lumbar spine, initial encounter: Secondary | ICD-10-CM | POA: Insufficient documentation

## 2013-04-29 DIAGNOSIS — I10 Essential (primary) hypertension: Secondary | ICD-10-CM | POA: Insufficient documentation

## 2013-04-29 DIAGNOSIS — E119 Type 2 diabetes mellitus without complications: Secondary | ICD-10-CM | POA: Insufficient documentation

## 2013-04-29 DIAGNOSIS — S39012A Strain of muscle, fascia and tendon of lower back, initial encounter: Secondary | ICD-10-CM

## 2013-04-29 DIAGNOSIS — Y929 Unspecified place or not applicable: Secondary | ICD-10-CM | POA: Insufficient documentation

## 2013-04-29 DIAGNOSIS — Z87442 Personal history of urinary calculi: Secondary | ICD-10-CM | POA: Insufficient documentation

## 2013-04-29 DIAGNOSIS — Z79899 Other long term (current) drug therapy: Secondary | ICD-10-CM | POA: Insufficient documentation

## 2013-04-29 DIAGNOSIS — X58XXXA Exposure to other specified factors, initial encounter: Secondary | ICD-10-CM | POA: Insufficient documentation

## 2013-04-29 DIAGNOSIS — Y939 Activity, unspecified: Secondary | ICD-10-CM | POA: Insufficient documentation

## 2013-04-29 LAB — URINALYSIS, ROUTINE W REFLEX MICROSCOPIC
Bilirubin Urine: NEGATIVE
Glucose, UA: NEGATIVE mg/dL
Leukocytes, UA: NEGATIVE
Nitrite: NEGATIVE
Protein, ur: NEGATIVE mg/dL
Specific Gravity, Urine: >1.03 — ABNORMAL HIGH (ref 1.005–1.030)
Urobilinogen, UA: 0.2 mg/dL (ref 0.0–1.0)
pH: 5.5 (ref 5.0–8.0)

## 2013-04-29 LAB — BASIC METABOLIC PANEL
BUN: 19 mg/dL (ref 6–23)
CO2: 23 mEq/L (ref 19–32)
Calcium: 10 mg/dL (ref 8.4–10.5)
Chloride: 97 mEq/L (ref 96–112)
Creatinine, Ser: 1.31 mg/dL (ref 0.50–1.35)
GFR calc Af Amer: 71 mL/min — ABNORMAL LOW (ref >90)
GFR calc non Af Amer: 62 mL/min — ABNORMAL LOW (ref >90)
Glucose, Bld: 152 mg/dL — ABNORMAL HIGH (ref 70–99)
Potassium: 3.6 mEq/L — ABNORMAL LOW (ref 3.7–5.3)
Sodium: 139 mEq/L (ref 137–147)

## 2013-04-29 LAB — URINE MICROSCOPIC-ADD ON

## 2013-04-29 LAB — CBC WITH DIFFERENTIAL/PLATELET
Basophils Absolute: 0 10*3/uL (ref 0.0–0.1)
Basophils Relative: 0 % (ref 0–1)
Eosinophils Absolute: 0.1 10*3/uL (ref 0.0–0.7)
Eosinophils Relative: 1 % (ref 0–5)
HCT: 44.8 % (ref 39.0–52.0)
Hemoglobin: 15.3 g/dL (ref 13.0–17.0)
Lymphocytes Relative: 52 % — ABNORMAL HIGH (ref 12–46)
Lymphs Abs: 4.9 10*3/uL — ABNORMAL HIGH (ref 0.7–4.0)
MCH: 28.4 pg (ref 26.0–34.0)
MCHC: 34.2 g/dL (ref 30.0–36.0)
MCV: 83.1 fL (ref 78.0–100.0)
Monocytes Absolute: 0.6 10*3/uL (ref 0.1–1.0)
Monocytes Relative: 7 % (ref 3–12)
Neutro Abs: 3.8 10*3/uL (ref 1.7–7.7)
Neutrophils Relative %: 40 % — ABNORMAL LOW (ref 43–77)
Platelets: 167 10*3/uL (ref 150–400)
RBC: 5.39 MIL/uL (ref 4.22–5.81)
RDW: 13.3 % (ref 11.5–15.5)
WBC: 9.5 10*3/uL (ref 4.0–10.5)

## 2013-04-29 MED ORDER — CYCLOBENZAPRINE HCL 10 MG PO TABS: 10.0000 mg | ORAL_TABLET | Freq: Two times a day (BID) | ORAL | Status: DC | PRN

## 2013-04-29 MED ORDER — OXYCODONE-ACETAMINOPHEN 5-325 MG PO TABS: 2.0000 | ORAL_TABLET | ORAL | Status: DC | PRN

## 2013-04-29 MED ORDER — HYDROMORPHONE HCL PF 1 MG/ML IJ SOLN
1.0000 mg | Freq: Once | INTRAMUSCULAR | Status: AC
  Administered 2013-04-29: 1 mg via INTRAVENOUS
  Filled 2013-04-29: qty 1

## 2013-04-29 MED ORDER — KETOROLAC TROMETHAMINE 30 MG/ML IJ SOLN
30.0000 mg | Freq: Once | INTRAMUSCULAR | Status: AC
  Administered 2013-04-29: 30 mg via INTRAVENOUS
  Filled 2013-04-29: qty 1

## 2013-04-29 NOTE — ED Notes (Signed)
Having pain in the left side of my back. Possible kidney stone

## 2013-04-29 NOTE — Discharge Instructions (Signed)
Back Pain, Adult Low back pain is very common. About 1 in 5 people have back pain.The cause of low back pain is rarely dangerous. The pain often gets better over time.About half of people with a sudden onset of back pain feel better in just 2 weeks. About 8 in 10 people feel better by 6 weeks.  CAUSES Some common causes of back pain include:  Strain of the muscles or ligaments supporting the spine.  Wear and tear (degeneration) of the spinal discs.  Arthritis.  Direct injury to the back. DIAGNOSIS Most of the time, the direct cause of low back pain is not known.However, back pain can be treated effectively even when the exact cause of the pain is unknown.Answering your caregiver's questions about your overall health and symptoms is one of the most accurate ways to make sure the cause of your pain is not dangerous. If your caregiver needs more information, he or she may order lab work or imaging tests (X-rays or MRIs).However, even if imaging tests show changes in your back, this usually does not require surgery. HOME CARE INSTRUCTIONS For many people, back pain returns.Since low back pain is rarely dangerous, it is often a condition that people can learn to manageon their own.   Remain active. It is stressful on the back to sit or stand in one place. Do not sit, drive, or stand in one place for more than 30 minutes at a time. Take short walks on level surfaces as soon as pain allows.Try to increase the length of time you walk each day.  Do not stay in bed.Resting more than 1 or 2 days can delay your recovery.  Do not avoid exercise or work.Your body is made to move.It is not dangerous to be active, even though your back may hurt.Your back will likely heal faster if you return to being active before your pain is gone.  Pay attention to your body when you bend and lift. Many people have less discomfortwhen lifting if they bend their knees, keep the load close to their bodies,and  avoid twisting. Often, the most comfortable positions are those that put less stress on your recovering back.  Find a comfortable position to sleep. Use a firm mattress and lie on your side with your knees slightly bent. If you lie on your back, put a pillow under your knees.  Only take over-the-counter or prescription medicines as directed by your caregiver. Over-the-counter medicines to reduce pain and inflammation are often the most helpful.Your caregiver may prescribe muscle relaxant drugs.These medicines help dull your pain so you can more quickly return to your normal activities and healthy exercise.  Put ice on the injured area.  Put ice in a plastic bag.  Place a towel between your skin and the bag.  Leave the ice on for 15-20 minutes, 03-04 times a day for the first 2 to 3 days. After that, ice and heat may be alternated to reduce pain and spasms.  Ask your caregiver about trying back exercises and gentle massage. This may be of some benefit.  Avoid feeling anxious or stressed.Stress increases muscle tension and can worsen back pain.It is important to recognize when you are anxious or stressed and learn ways to manage it.Exercise is a great option. SEEK MEDICAL CARE IF:  You have pain that is not relieved with rest or medicine.  You have pain that does not improve in 1 week.  You have new symptoms.  You are generally not feeling well. SEEK   IMMEDIATE MEDICAL CARE IF:   You have pain that radiates from your back into your legs.  You develop new bowel or bladder control problems.  You have unusual weakness or numbness in your arms or legs.  You develop nausea or vomiting.  You develop abdominal pain.  You feel faint. Document Released: 04/15/2005 Document Revised: 10/15/2011 Document Reviewed: 09/03/2010 [employer] Patient Information 2014 [employer].  

## 2013-04-29 NOTE — ED Provider Notes (Signed)
CSN: [id number]     Arrival date & time 04/29/13  1936 History  This chart was scribed for Shaune Pollack, MD by Roxan Diesel, ED scribe.  This patient was seen in room APA06/APA06 and the patient's care was started at 10:41 PM.   Chief Complaint  Patient presents with  . Flank Pain    Patient is a 53 y.o. male presenting with flank pain. The history is provided by the patient. No language interpreter was used.  Flank Pain This is a recurrent problem. The current episode started 2 days ago. The problem occurs constantly. The problem has been gradually worsening. Pertinent negatives include no chest pain, no abdominal pain, no headaches and no shortness of breath. He has tried nothing for the symptoms.    HPI Comments: Roger Prince is a 53 y.o. male with h/o DM, HTN and high cholesterol who presents to the Emergency Department complaining of severe left flank pain that began 2 days ago.  Pt has h/o kidney stones and states his current pain feels the same except that prior stones were accompanied with nausea, vomiting, and subjective fever.  He denies hematemesis.  He denies recent injuries.  No treatments were attempted pta.  He has kidney stones every 5-6 years and has never required treatment for them.  Pt does not have a urologist.   History reviewed. No pertinent past medical history.  History reviewed. No pertinent past surgical history.  No family history on file.   History  Substance Use Topics  . Smoking status: Never Smoker   . Smokeless tobacco: Never Used  . Alcohol Use: Not on file     Review of Systems  Respiratory: Negative for shortness of breath.   Cardiovascular: Negative for chest pain.  Gastrointestinal: Negative for abdominal pain.  Genitourinary: Positive for flank pain.  Neurological: Negative for headaches.  All other systems reviewed and are negative.     Allergies  Meperidine hcl  Home Medications   Current Outpatient Rx  Name  Route  Sig   Dispense  Refill  . benazepril (LOTENSIN) 20 MG tablet   Oral   Take 1 tablet (20 mg total) by mouth daily.   30 tablet   5   . lovastatin (MEVACOR) 20 MG tablet   Oral   Take 1 tablet (20 mg total) by mouth at bedtime.   30 tablet   11   . metFORMIN (GLUCOPHAGE) 1000 MG tablet   Oral   Take 1,000 mg by mouth daily with breakfast.         . triamterene-hydrochlorothiazide (MAXZIDE-25) 37.5-25 MG per tablet   Oral   Take 1 tablet by mouth daily.   30 tablet   11     Reduced dose effective 03/16/2013    BP 134/93  Pulse 109  Temp(Src) 99.1 F (37.3 C)  Resp 18  Ht 5\' 9"  (1.753 m)  Wt 257 lb (116.574 kg)  BMI 37.93 kg/m2  SpO2 96%  Physical Exam  Nursing note and vitals reviewed. Constitutional: He is oriented to person, place, and time. He appears well-developed and well-nourished. No distress.  HENT:  Head: Normocephalic and atraumatic.  Eyes: EOM are normal.  Neck: Neck supple. No tracheal deviation present.  Cardiovascular: Normal rate.   Pulmonary/Chest: Effort normal. No respiratory distress.  Abdominal: Soft. Bowel sounds are normal. There is tenderness. There is no rebound and no guarding.  Mild left CVA tenderness  Musculoskeletal: Normal range of motion.  Neurological: He is alert  and oriented to person, place, and time.  Skin: Skin is warm and dry.  Psychiatric: He has a normal mood and affect. His behavior is normal.    ED Course  Procedures (including critical care time)  DIAGNOSTIC STUDIES: Oxygen Saturation is 96% on room air, normal by my interpretation.    COORDINATION OF CARE: 10:45 PM-Discussed treatment plan which includes CT abdomen, labs, and pain medication with pt at bedside and pt agreed to plan.    Labs Review Labs Reviewed  URINALYSIS, ROUTINE W REFLEX MICROSCOPIC   Imaging Review No results found.  EKG Interpretation   None       MDM  No diagnosis found. 53 year old male history of kidney stones who presents  today with some left-sided low back pain which appears musculoskeletal in nature. Plan pain medicine and muscle relaxants.  I personally performed the services described in this documentation, which was scribed in my presence. The recorded information has been reviewed and considered.   Shaune Pollack, MD 04/29/13 [phone]

## 2013-04-30 NOTE — ED Notes (Signed)
Pt alert & oriented x4, stable gait. Patient given discharge instructions, paperwork & prescription(s). Patient  instructed to stop at the registration desk to finish any additional paperwork. Patient verbalized understanding. Pt left department w/ no further questions. 

## 2013-05-02 ENCOUNTER — Emergency Department (HOSPITAL_COMMUNITY): Payer: BC Managed Care – PPO

## 2013-05-02 ENCOUNTER — Emergency Department (HOSPITAL_COMMUNITY)
Admission: EM | Admit: 2013-05-02 | Discharge: 2013-05-02 | Disposition: A | Discharge status: Home / Self Care | Payer: BC Managed Care – PPO | Attending: Emergency Medicine | Admitting: Emergency Medicine

## 2013-05-02 ENCOUNTER — Encounter (HOSPITAL_COMMUNITY): Payer: Self-pay | Admitting: Emergency Medicine

## 2013-05-02 DIAGNOSIS — R109 Unspecified abdominal pain: Secondary | ICD-10-CM

## 2013-05-02 DIAGNOSIS — R1084 Generalized abdominal pain: Secondary | ICD-10-CM | POA: Insufficient documentation

## 2013-05-02 DIAGNOSIS — M549 Dorsalgia, unspecified: Secondary | ICD-10-CM | POA: Insufficient documentation

## 2013-05-02 DIAGNOSIS — I10 Essential (primary) hypertension: Secondary | ICD-10-CM | POA: Insufficient documentation

## 2013-05-02 DIAGNOSIS — E119 Type 2 diabetes mellitus without complications: Secondary | ICD-10-CM | POA: Insufficient documentation

## 2013-05-02 DIAGNOSIS — Z79899 Other long term (current) drug therapy: Secondary | ICD-10-CM | POA: Insufficient documentation

## 2013-05-02 DIAGNOSIS — Z87891 Personal history of nicotine dependence: Secondary | ICD-10-CM | POA: Insufficient documentation

## 2013-05-02 HISTORY — DX: Essential (primary) hypertension: I10

## 2013-05-02 HISTORY — DX: Type 2 diabetes mellitus without complications: E11.9

## 2013-05-02 LAB — COMPREHENSIVE METABOLIC PANEL
ALT: 23 U/L (ref 0–53)
AST: 22 U/L (ref 0–37)
Albumin: 3.8 g/dL (ref 3.5–5.2)
Alkaline Phosphatase: 50 U/L (ref 39–117)
BUN: 15 mg/dL (ref 6–23)
CO2: 25 mEq/L (ref 19–32)
Calcium: 9.4 mg/dL (ref 8.4–10.5)
Chloride: 101 mEq/L (ref 96–112)
Creatinine, Ser: 1.16 mg/dL (ref 0.50–1.35)
GFR calc Af Amer: 83 mL/min — ABNORMAL LOW (ref >90)
GFR calc non Af Amer: 71 mL/min — ABNORMAL LOW (ref >90)
Glucose, Bld: 107 mg/dL — ABNORMAL HIGH (ref 70–99)
Potassium: 4 mEq/L (ref 3.7–5.3)
Sodium: 139 mEq/L (ref 137–147)
Total Bilirubin: 0.6 mg/dL (ref 0.3–1.2)
Total Protein: 7.6 g/dL (ref 6.0–8.3)

## 2013-05-02 LAB — CBC WITH DIFFERENTIAL/PLATELET
Basophils Absolute: 0 10*3/uL (ref 0.0–0.1)
Basophils Relative: 0 % (ref 0–1)
Eosinophils Absolute: 0.1 10*3/uL (ref 0.0–0.7)
Eosinophils Relative: 1 % (ref 0–5)
HCT: 43.6 % (ref 39.0–52.0)
Hemoglobin: 15 g/dL (ref 13.0–17.0)
Lymphocytes Relative: 45 % (ref 12–46)
Lymphs Abs: 3.8 10*3/uL (ref 0.7–4.0)
MCH: 28.8 pg (ref 26.0–34.0)
MCHC: 34.4 g/dL (ref 30.0–36.0)
MCV: 83.7 fL (ref 78.0–100.0)
Monocytes Absolute: 0.5 10*3/uL (ref 0.1–1.0)
Monocytes Relative: 6 % (ref 3–12)
Neutro Abs: 4 10*3/uL (ref 1.7–7.7)
Neutrophils Relative %: 48 % (ref 43–77)
Platelets: 169 10*3/uL (ref 150–400)
RBC: 5.21 MIL/uL (ref 4.22–5.81)
RDW: 13.3 % (ref 11.5–15.5)
WBC: 8.3 10*3/uL (ref 4.0–10.5)

## 2013-05-02 LAB — GLUCOSE, CAPILLARY: Glucose-Capillary: 120 mg/dL — ABNORMAL HIGH (ref 70–99)

## 2013-05-02 MED ORDER — OXYCODONE-ACETAMINOPHEN 5-325 MG PO TABS: 2.0000 | ORAL_TABLET | ORAL | Status: DC | PRN

## 2013-05-02 MED ORDER — HYDROMORPHONE HCL PF 1 MG/ML IJ SOLN
1.0000 mg | Freq: Once | INTRAMUSCULAR | Status: AC
  Administered 2013-05-02: 1 mg via INTRAVENOUS
  Filled 2013-05-02: qty 1

## 2013-05-02 MED ORDER — PREDNISONE 10 MG PO TABS: ORAL_TABLET | ORAL | Status: DC

## 2013-05-02 MED ORDER — ONDANSETRON HCL 4 MG/2ML IJ SOLN
4.0000 mg | Freq: Once | INTRAMUSCULAR | Status: AC
  Administered 2013-05-02: 4 mg via INTRAMUSCULAR
  Filled 2013-05-02: qty 2

## 2013-05-02 MED ORDER — SODIUM CHLORIDE 0.9 % IV SOLN
Freq: Once | INTRAVENOUS | Status: AC
  Administered 2013-05-02: 10:00:00 via INTRAVENOUS

## 2013-05-02 MED ORDER — IOHEXOL 300 MG/ML  SOLN
50.0000 mL | Freq: Once | INTRAMUSCULAR | Status: AC | PRN
  Administered 2013-05-02: 50 mL via ORAL

## 2013-05-02 MED ORDER — ONDANSETRON 4 MG PO TBDP
4.0000 mg | ORAL_TABLET | Freq: Once | ORAL | Status: AC
  Administered 2013-05-02: 4 mg via ORAL
  Filled 2013-05-02: qty 1

## 2013-05-02 MED ORDER — IOHEXOL 300 MG/ML  SOLN
100.0000 mL | Freq: Once | INTRAMUSCULAR | Status: AC | PRN
  Administered 2013-05-02: 100 mL via INTRAVENOUS

## 2013-05-02 NOTE — Discharge Instructions (Signed)
Back Pain, Adult Low back pain is very common. About 1 in 5 people have back pain.The cause of low back pain is rarely dangerous. The pain often gets better over time.About half of people with a sudden onset of back pain feel better in just 2 weeks. About 8 in 10 people feel better by 6 weeks.  CAUSES Some common causes of back pain include:  Strain of the muscles or ligaments supporting the spine.  Wear and tear (degeneration) of the spinal discs.  Arthritis.  Direct injury to the back. DIAGNOSIS Most of the time, the direct cause of low back pain is not known.However, back pain can be treated effectively even when the exact cause of the pain is unknown.Answering your caregiver's questions about your overall health and symptoms is one of the most accurate ways to make sure the cause of your pain is not dangerous. If your caregiver needs more information, he or she may order lab work or imaging tests (X-rays or MRIs).However, even if imaging tests show changes in your back, this usually does not require surgery. HOME CARE INSTRUCTIONS For many people, back pain returns.Since low back pain is rarely dangerous, it is often a condition that people can learn to manageon their own.   Remain active. It is stressful on the back to sit or stand in one place. Do not sit, drive, or stand in one place for more than 30 minutes at a time. Take short walks on level surfaces as soon as pain allows.Try to increase the length of time you walk each day.  Do not stay in bed.Resting more than 1 or 2 days can delay your recovery.  Do not avoid exercise or work.Your body is made to move.It is not dangerous to be active, even though your back may hurt.Your back will likely heal faster if you return to being active before your pain is gone.  Pay attention to your body when you bend and lift. Many people have less discomfortwhen lifting if they bend their knees, keep the load close to their bodies,and  avoid twisting. Often, the most comfortable positions are those that put less stress on your recovering back.  Find a comfortable position to sleep. Use a firm mattress and lie on your side with your knees slightly bent. If you lie on your back, put a pillow under your knees.  Only take over-the-counter or prescription medicines as directed by your caregiver. Over-the-counter medicines to reduce pain and inflammation are often the most helpful.Your caregiver may prescribe muscle relaxant drugs.These medicines help dull your pain so you can more quickly return to your normal activities and healthy exercise.  Put ice on the injured area.  Put ice in a plastic bag.  Place a towel between your skin and the bag.  Leave the ice on for 15-20 minutes, 03-04 times a day for the first 2 to 3 days. After that, ice and heat may be alternated to reduce pain and spasms.  Ask your caregiver about trying back exercises and gentle massage. This may be of some benefit.  Avoid feeling anxious or stressed.Stress increases muscle tension and can worsen back pain.It is important to recognize when you are anxious or stressed and learn ways to manage it.Exercise is a great option. SEEK MEDICAL CARE IF:  You have pain that is not relieved with rest or medicine.  You have pain that does not improve in 1 week.  You have new symptoms.  You are generally not feeling well. SEEK   IMMEDIATE MEDICAL CARE IF:   You have pain that radiates from your back into your legs.  You develop new bowel or bladder control problems.  You have unusual weakness or numbness in your arms or legs.  You develop nausea or vomiting.  You develop abdominal pain.  You feel faint. Document Released: 04/15/2005 Document Revised: 10/15/2011 Document Reviewed: 09/03/2010 [employer] Patient Information 2014 [employer].  

## 2013-05-02 NOTE — ED Notes (Signed)
Pt reports was seen for left flank pain and left sided abdominal pain on 04/29/13. Pt reports pain is not getting any better. Pt reports n/v. nad noted.

## 2013-05-02 NOTE — ED Provider Notes (Signed)
CSN: [id number]     Arrival date & time 05/02/13  2706 History   First MD Initiated Contact with Patient 05/02/13 [phone]     Chief Complaint  Patient presents with  . Abdominal Pain  . Flank Pain   (Consider location/radiation/quality/duration/timing/severity/associated sxs/prior Treatment) Patient is a 53 y.o. male presenting with back pain. The history is provided by the patient. No language interpreter was used.  Back Pain Location:  Generalized Quality:  Aching Radiates to:  Does not radiate Pain severity:  Moderate Pain is:  Worse during the day Onset quality:  Sudden Timing:  Constant Progression:  Worsening Chronicity:  New Relieved by:  Nothing Worsened by:  Nothing tried Ineffective treatments:  None tried Associated symptoms: abdominal pain     Past Medical History  Diagnosis Date  . Hypertension   . Diabetes mellitus without complication    History reviewed. No pertinent past surgical history. Family History  Problem Relation Age of Onset  . Congestive Heart Failure Father    History  Substance Use Topics  . Smoking status: Former Smoker    Types: Cigars  . Smokeless tobacco: Never Used  . Alcohol Use: Not on file    Review of Systems  Gastrointestinal: Positive for abdominal pain.  Musculoskeletal: Positive for back pain.  All other systems reviewed and are negative.    Allergies  Meperidine hcl  Home Medications   Current Outpatient Rx  Name  Route  Sig  Dispense  Refill  . benazepril (LOTENSIN) 20 MG tablet   Oral   Take 1 tablet (20 mg total) by mouth daily.   30 tablet   5   . cyclobenzaprine (FLEXERIL) 10 MG tablet   Oral   Take 1 tablet (10 mg total) by mouth 2 (two) times daily as needed for muscle spasms.   20 tablet   0   . lovastatin (MEVACOR) 20 MG tablet   Oral   Take 1 tablet (20 mg total) by mouth at bedtime.   30 tablet   11   . metFORMIN (GLUCOPHAGE) 1000 MG tablet   Oral   Take 1,000 mg by mouth daily with  breakfast.         . oxyCODONE-acetaminophen (PERCOCET/ROXICET) 5-325 MG per tablet   Oral   Take 2 tablets by mouth every 4 (four) hours as needed for severe pain.   15 tablet   0   . triamterene-hydrochlorothiazide (MAXZIDE-25) 37.5-25 MG per tablet   Oral   Take 1 tablet by mouth daily.   30 tablet   11     Reduced dose effective 03/16/2013    BP 120/80  Pulse 82  Temp(Src) 98.1 F (36.7 C) (Oral)  Resp 18  Ht 5\' 9"  (1.753 m)  Wt 257 lb (116.574 kg)  BMI 37.93 kg/m2  SpO2 93% Physical Exam  Nursing note and vitals reviewed. Constitutional: He is oriented to person, place, and time. He appears well-developed and well-nourished.  HENT:  Head: Normocephalic.  Right Ear: External ear normal.  Left Ear: External ear normal.  Eyes: Conjunctivae and EOM are normal. Pupils are equal, round, and reactive to light.  Neck: Normal range of motion.  Cardiovascular: Normal rate.   Pulmonary/Chest: Effort normal.  Abdominal: Soft. There is tenderness. There is guarding.  Musculoskeletal: Normal range of motion.  Neurological: He is alert and oriented to person, place, and time. He has normal reflexes.  Skin: Skin is warm.  Psychiatric: He has a normal mood and affect.  ED Course  Procedures (including critical care time) Labs Review Labs Reviewed  GLUCOSE, CAPILLARY - Abnormal; Notable for the following:    Glucose-Capillary 120 (*)    All other components within normal limits  COMPREHENSIVE METABOLIC PANEL - Abnormal; Notable for the following:    Glucose, Bld 107 (*)    GFR calc non Af Amer 71 (*)    GFR calc Af Amer 83 (*)    All other components within normal limits  CBC WITH DIFFERENTIAL  URINALYSIS, ROUTINE W REFLEX MICROSCOPIC   Imaging Review Ct Abdomen Pelvis W Contrast  05/02/2013   CLINICAL DATA:  pain, left-sided pain  EXAM: CT ABDOMEN AND PELVIS WITH CONTRAST  TECHNIQUE: Multidetector CT imaging of the abdomen and pelvis was performed using the  standard protocol following bolus administration of intravenous contrast.  CONTRAST:  76mL OMNIPAQUE IOHEXOL 300 MG/ML SOLN, 161mL OMNIPAQUE IOHEXOL 300 MG/ML SOLN  COMPARISON:  CT ABD/PELV WO CM dated 04/29/2013; CT ABDOMEN W/O CM dated 02/01/2008  FINDINGS: There is linear pleural parenchymal thickening at the lung bases unchanged from prior. No pericardial fluid. High-density lesion the left lateral hepatic lobe has simple fluid attenuation consists with a benign hepatic cysts (image 20). Gallbladder, pancreas, spleen, adrenal glands, and right kidney are normal. The nonenhancing cysts in the lower pole of the left kidney.  The stomach, small bowel, appendix, and cecum are normal. There are diverticula of the descending colon. There is very mild of pericolonic inflammation at the splenic flexure (image 28) and along the descending colon (image 30).  Abdominal or is normal caliber. No retroperitoneal periportal lymphadenopathy.  No distal ureteral stones or bladder stones. Prostate gland is normal. Small bilateral fat filled inguinal hernias are noted. No pelvic lymphadenopathy. Mild degenerative change of the spine.  IMPRESSION: 1. Mild pericolonic inflammation along the descending colon in the vicinity of of several diverticula. Findings are nonspecific but could represent mild acute diverticulitis. 2. No additional acute findings abdomen pelvis. 3. Normal appendix. 4. No ureterolithiasis or obstructive uropathy.   Electronically Signed   By: Suzy Bouchard M.D.   On: 05/02/2013 14:15    EKG Interpretation   None       MDM   1. Abdominal  pain, other specified site   2. Back pain    Pt reports he has had diverticulitis in the past.   Pt reports pain on left side feels the same.   Pt had a ct scah which showed no kidney stone.   Contrasted ct scan shows normal abdomen.   I suspect pain may be from back.  Possible nerve related,  No rah noted.   I will treat with prednisone and percocet.   Pt advised  to see his MD for recheck.    [place], PA-C 05/02/13 1646

## 2013-05-02 NOTE — ED Notes (Signed)
Pt verbalized understanding of d/c instructions esp. Not to drive after taking pain med percocet within 4 hours

## 2013-05-07 NOTE — ED Provider Notes (Signed)
Medical screening examination/treatment/procedure(s) were performed by non-physician practitioner and as supervising physician I was immediately available for consultation/collaboration.  Orpah Greek, MD 05/07/13 [phone]

## 2013-07-06 ENCOUNTER — Ambulatory Visit: Payer: BC Managed Care – PPO | Admitting: Family Medicine

## 2013-08-19 ENCOUNTER — Ambulatory Visit: Payer: BC Managed Care – PPO | Admitting: Family Medicine

## 2013-09-14 ENCOUNTER — Ambulatory Visit: Payer: BC Managed Care – PPO | Admitting: Family Medicine

## 2013-09-30 ENCOUNTER — Other Ambulatory Visit: Payer: Self-pay | Admitting: Family Medicine

## 2013-10-06 ENCOUNTER — Ambulatory Visit (INDEPENDENT_AMBULATORY_CARE_PROVIDER_SITE_OTHER): Payer: BC Managed Care – PPO | Admitting: Family Medicine

## 2013-10-06 ENCOUNTER — Encounter: Payer: Self-pay | Admitting: Family Medicine

## 2013-10-06 VITALS — BP 120/82 | HR 98 | Resp 16 | Wt 262.0 lb

## 2013-10-06 DIAGNOSIS — I10 Essential (primary) hypertension: Secondary | ICD-10-CM

## 2013-10-06 DIAGNOSIS — R040 Epistaxis: Secondary | ICD-10-CM

## 2013-10-06 DIAGNOSIS — E785 Hyperlipidemia, unspecified: Secondary | ICD-10-CM

## 2013-10-06 DIAGNOSIS — Z1211 Encounter for screening for malignant neoplasm of colon: Secondary | ICD-10-CM

## 2013-10-06 DIAGNOSIS — E119 Type 2 diabetes mellitus without complications: Secondary | ICD-10-CM

## 2013-10-06 DIAGNOSIS — Z125 Encounter for screening for malignant neoplasm of prostate: Secondary | ICD-10-CM

## 2013-10-06 DIAGNOSIS — E669 Obesity, unspecified: Secondary | ICD-10-CM

## 2013-10-06 MED ORDER — ASPIRIN EC 81 MG PO TBEC: 81.0000 mg | DELAYED_RELEASE_TABLET | Freq: Every day | ORAL | Status: DC

## 2013-10-06 NOTE — Patient Instructions (Addendum)
F/u Nov 22 or after, call if you need me before  Fasting lipid, cmp and EGFR, hBA1C this week  BP is excellent , no med change  Please have an eye exam done , this is past due  You need to take aspirin 81 mg daily (coated) to reduce the risk of heart disease  It is important that you exercise regularly at least 30 minutes 5 times a week. If you develop chest pain, have severe difficulty breathing, or feel very tired, stop exercising immediately and seek medical attention    Weight loss goal of 6 to 10 pounds   You are referred to Dr Rourke for colon screening, and to ENT re left sided nose bleeds  Fasting lipid, cmp and EGFr, HBA1C, pSa and microalb Nov 19 or after 

## 2013-10-08 NOTE — Assessment & Plan Note (Signed)
New unilateral epistaxis , refer to ENT for further eval

## 2013-10-08 NOTE — Assessment & Plan Note (Signed)
Deteriorated. Patient re-educated about  the importance of commitment to a  minimum of 150 minutes of exercise per week. The importance of healthy food choices with portion control discussed. Encouraged to start a food diary, count calories and to consider  joining a support group. Sample diet sheets offered. Goals set by the patient for the next several months.    

## 2013-10-08 NOTE — Assessment & Plan Note (Signed)
Uncontrolled when last checked Updated lab needed for this visit. Hyperlipidemia:Low fat diet discussed and encouraged.

## 2013-10-08 NOTE — Assessment & Plan Note (Signed)
Updated lab needed at/ before next visit. Patient advised to reduce carb and sweets, commit to regular physical activity, take meds as prescribed, test blood as directed, and attempt to lose weight, to improve blood sugar control.  

## 2013-10-08 NOTE — Progress Notes (Signed)
   Subjective:    Patient ID: Roger Prince, male    DOB: 12-13-1960, 53 y.o.   MRN: [id number]  HPI The PT is here for follow up and re-evaluation of chronic medical conditions, medication management and review of any available recent lab and radiology data.  Preventive health is updated, specifically  Cancer screening and Immunization.   States he was in [place] [facility] in [place], no significant injury but was "on his back" for a  Short while The PT denies any adverse reactions to current medications since the last visit.  Recent unilateral epistaxis unproviked  On 2 occasions ,neve happened before No change in lifestyle, no weight loss denies polyuria, polydipsia or hypoglycemic episodes    Review of Systems Denies recent fever or chills. Denies sinus pressure, nasal congestion, ear pain or sore throat. Denies chest congestion, productive cough or wheezing. Denies chest pains, palpitations and leg swelling Denies abdominal pain, nausea, vomiting,diarrhea or constipation.   Denies dysuria, frequency, hesitancy or incontinence. Denies joint pain, swelling and limitation in mobility. Denies headaches, seizures, numbness, or tingling. Denies depression, anxiety or insomnia. Denies skin break down or rash.         Objective:   Physical Exam BP 120/82  Pulse 98  Resp 16  Wt 262 lb (118.842 kg)  SpO2 97% Patient alert and oriented and in no cardiopulmonary distress.  HEENT: No facial asymmetry, EOMI,   oropharynx pink and moist.  Neck supple no JVD, no mass. Nasal mucosa that is visible : no mild eryhtema and edema of left nostril, no visible lesion, TM clear Chest: Clear to auscultation bilaterally.  CVS: S1, S2 no murmurs, no S3.  ABD: Soft non tender.   Ext: No edema  MS: Adequate ROM spine, shoulders, hips and knees.  Skin: Intact, no ulcerations or rash noted.  Psych: Good eye contact, normal affect. Memory intact not anxious or depressed appearing.  CNS: CN 2-12 intact,  power,  normal throughout.no focal deficits noted.  .      Assessment & Plan:  HYPERTENSION Controlled, no change in medication DASH diet and commitment to daily physical activity for a minimum of 30 minutes discussed and encouraged, as a part of hypertension management. The importance of attaining a healthy weight is also discussed.   DM type 2, goal A1c below 7 Updated lab needed at/ before next visit. Patient advised to reduce carb and sweets, commit to regular physical activity, take meds as prescribed, test blood as directed, and attempt to lose weight, to improve blood sugar control.   Hyperlipidemia LDL goal < 100 Uncontrolled when last checked Updated lab needed for this visit. Hyperlipidemia:Low fat diet discussed and encouraged.     OBESITY, UNSPECIFIED Deteriorated. Patient re-educated about  the importance of commitment to a  minimum of 150 minutes of exercise per week. The importance of healthy food choices with portion control discussed. Encouraged to start a food diary, count calories and to consider  joining a support group. Sample diet sheets offered. Goals set by the patient for the next several months.     Left-sided epistaxis New unilateral epistaxis , refer to ENT for further eval

## 2013-10-08 NOTE — Assessment & Plan Note (Signed)
Controlled, no change in medication DASH diet and commitment to daily physical activity for a minimum of 30 minutes discussed and encouraged, as a part of hypertension management. The importance of attaining a healthy weight is also discussed.  

## 2013-10-09 LAB — LIPID PANEL
Cholesterol: 190 mg/dL (ref 0–200)
HDL: 42 mg/dL (ref >39)
LDL Cholesterol: 120 mg/dL — ABNORMAL HIGH (ref 0–99)
Total CHOL/HDL Ratio: 4.5 Ratio
Triglycerides: 138 mg/dL (ref <150)
VLDL: 28 mg/dL (ref 0–40)

## 2013-10-09 LAB — COMPLETE METABOLIC PANEL WITH GFR
ALT: 19 U/L (ref 0–53)
AST: 21 U/L (ref 0–37)
Albumin: 4.5 g/dL (ref 3.5–5.2)
Alkaline Phosphatase: 60 U/L (ref 39–117)
BUN: 15 mg/dL (ref 6–23)
CO2: 26 mEq/L (ref 19–32)
Calcium: 9.9 mg/dL (ref 8.4–10.5)
Chloride: 101 mEq/L (ref 96–112)
Creat: 1.26 mg/dL (ref 0.50–1.35)
GFR, Est African American: 75 mL/min
GFR, Est Non African American: 65 mL/min
Glucose, Bld: 104 mg/dL — ABNORMAL HIGH (ref 70–99)
Potassium: 4.7 mEq/L (ref 3.5–5.3)
Sodium: 137 mEq/L (ref 135–145)
Total Bilirubin: 0.6 mg/dL (ref 0.2–1.2)
Total Protein: 7.4 g/dL (ref 6.0–8.3)

## 2013-10-09 LAB — HEMOGLOBIN A1C
Hgb A1c MFr Bld: 6.8 % — ABNORMAL HIGH (ref <5.7)
Mean Plasma Glucose: 148 mg/dL — ABNORMAL HIGH (ref <117)

## 2013-10-10 ENCOUNTER — Other Ambulatory Visit: Payer: Self-pay | Admitting: Family Medicine

## 2013-10-15 ENCOUNTER — Other Ambulatory Visit: Payer: Self-pay

## 2013-10-15 MED ORDER — LOVASTATIN 40 MG PO TABS: 40.0000 mg | ORAL_TABLET | Freq: Every day | ORAL | Status: DC

## 2013-10-21 ENCOUNTER — Ambulatory Visit (INDEPENDENT_AMBULATORY_CARE_PROVIDER_SITE_OTHER): Payer: BC Managed Care – PPO | Admitting: Otolaryngology

## 2013-10-27 ENCOUNTER — Other Ambulatory Visit: Payer: Self-pay | Admitting: Family Medicine

## 2013-12-31 ENCOUNTER — Other Ambulatory Visit: Payer: Self-pay | Admitting: Family Medicine

## 2014-03-07 ENCOUNTER — Other Ambulatory Visit: Payer: Self-pay

## 2014-03-07 ENCOUNTER — Telehealth: Payer: Self-pay

## 2014-03-07 DIAGNOSIS — Z1211 Encounter for screening for malignant neoplasm of colon: Secondary | ICD-10-CM

## 2014-03-08 NOTE — Telephone Encounter (Signed)
Gastroenterology Pre-Procedure Review  Request Date: 03/07/2014 Requesting Physician: Dr. Moshe Cipro  PATIENT REVIEW QUESTIONS: The patient responded to the following health history questions as indicated:    1. Diabetes Melitis: Yes 2. Joint replacements in the past 12 months: no 3. Major health problems in the past 3 months: no 4. Has an artificial valve or MVP: no 5. Has a defibrillator: no 6. Has been advised in past to take antibiotics in advance of a procedure like teeth cleaning: no    MEDICATIONS & ALLERGIES:    Patient reports the following regarding taking any blood thinners:   Plavix? no Aspirin? YES Coumadin? no  Patient confirms/reports the following medications:  Current Outpatient Prescriptions  Medication Sig Dispense Refill  . benazepril (LOTENSIN) 20 MG tablet TAKE ONE TABLET BY MOUTH ONCE DAILY 30 tablet 3  . lovastatin (MEVACOR) 40 MG tablet Take 1 tablet (40 mg total) by mouth at bedtime. 30 tablet 5  . metFORMIN (GLUCOPHAGE) 1000 MG tablet Take 1,000 mg by mouth daily with breakfast.    . triamterene-hydrochlorothiazide (MAXZIDE-25) 37.5-25 MG per tablet Take 1 tablet by mouth daily. 30 tablet 11  . aspirin EC 81 MG tablet Take 1 tablet (81 mg total) by mouth daily. 90 tablet 3  . cyclobenzaprine (FLEXERIL) 10 MG tablet Take 1 tablet (10 mg total) by mouth 2 (two) times daily as needed for muscle spasms. 20 tablet 0   No current facility-administered medications for this visit.    Patient confirms/reports the following allergies:  Allergies  Allergen Reactions  . Meperidine Hcl Other (See Comments)    Burning    No orders of the defined types were placed in this encounter.    AUTHORIZATION INFORMATION Primary Insurance:   ID #:   Group #:  Pre-Cert / Auth required: Pre-Cert / Auth #:   Secondary Insurance:   ID #: Group #:  Pre-Cert / Auth required:  Pre-Cert / Auth #:   SCHEDULE INFORMATION: Procedure has been scheduled as follows:  Date:  04/08/2014                Time:  10:30 Am Location: [facility] Short Stay  This Gastroenterology Pre-Precedure Review Form is being routed to the following provider(s): Dr. Barney Drain

## 2014-03-10 ENCOUNTER — Ambulatory Visit: Payer: BC Managed Care – PPO | Admitting: Family Medicine

## 2014-03-16 MED ORDER — PEG-KCL-NACL-NASULF-NA ASC-C 100 G PO SOLR: 1.0000 | ORAL | Status: DC

## 2014-03-16 NOTE — Telephone Encounter (Signed)
MOVI PREP SPLIT DOSING, REGULAR BREAKFAST. CLEAR LIQUIDS AFTER 9 AM.  CONTINUE GLUCOPHAGE.  HALF MAXZIDE ON DAY PRIOR TO TCS. HOLD MAXZIDE DAY OF TCS.

## 2014-03-16 NOTE — Telephone Encounter (Signed)
Rx was sent to the pharmacy and instructions were mailed to pt.

## 2014-03-17 ENCOUNTER — Telehealth: Payer: Self-pay

## 2014-03-17 MED ORDER — PEG 3350-KCL-NA BICARB-NACL 420 G PO SOLR: 4000.0000 mL | ORAL | Status: DC

## 2014-03-17 NOTE — Telephone Encounter (Signed)
Movie prep not covered by insurance. Sending in [place] prep and mailing new instructions to pt. Pt is aware to look for the new instructions for the [place] prep.

## 2014-04-04 ENCOUNTER — Telehealth: Payer: Self-pay

## 2014-04-04 NOTE — Telephone Encounter (Signed)
I called pt on Friday, 04/01/2014 to update med list prior to colonoscopy. He said there have been no changes in his meds.

## 2014-04-08 ENCOUNTER — Ambulatory Visit (HOSPITAL_COMMUNITY)
Admission: RE | Admit: 2014-04-08 | Discharge: 2014-04-08 | Disposition: A | Discharge status: Home / Self Care | Payer: BC Managed Care – PPO | Source: Ambulatory Visit | Attending: Gastroenterology | Admitting: Gastroenterology

## 2014-04-08 ENCOUNTER — Encounter (HOSPITAL_COMMUNITY): Payer: Self-pay | Admitting: *Deleted

## 2014-04-08 ENCOUNTER — Encounter (HOSPITAL_COMMUNITY)
Admission: RE | Disposition: A | Discharge status: Home / Self Care | Payer: Self-pay | Source: Ambulatory Visit | Attending: Gastroenterology

## 2014-04-08 DIAGNOSIS — I10 Essential (primary) hypertension: Secondary | ICD-10-CM | POA: Insufficient documentation

## 2014-04-08 DIAGNOSIS — E119 Type 2 diabetes mellitus without complications: Secondary | ICD-10-CM | POA: Diagnosis not present

## 2014-04-08 DIAGNOSIS — Z87891 Personal history of nicotine dependence: Secondary | ICD-10-CM | POA: Insufficient documentation

## 2014-04-08 DIAGNOSIS — D128 Benign neoplasm of rectum: Secondary | ICD-10-CM

## 2014-04-08 DIAGNOSIS — K648 Other hemorrhoids: Secondary | ICD-10-CM | POA: Diagnosis not present

## 2014-04-08 DIAGNOSIS — Z7982 Long term (current) use of aspirin: Secondary | ICD-10-CM | POA: Diagnosis not present

## 2014-04-08 DIAGNOSIS — Z1211 Encounter for screening for malignant neoplasm of colon: Secondary | ICD-10-CM | POA: Diagnosis present

## 2014-04-08 DIAGNOSIS — D123 Benign neoplasm of transverse colon: Secondary | ICD-10-CM

## 2014-04-08 DIAGNOSIS — K635 Polyp of colon: Secondary | ICD-10-CM | POA: Diagnosis not present

## 2014-04-08 DIAGNOSIS — D124 Benign neoplasm of descending colon: Secondary | ICD-10-CM

## 2014-04-08 DIAGNOSIS — Q438 Other specified congenital malformations of intestine: Secondary | ICD-10-CM | POA: Diagnosis not present

## 2014-04-08 HISTORY — PX: COLONOSCOPY: SHX5424

## 2014-04-08 SURGERY — COLONOSCOPY
Anesthesia: Moderate Sedation

## 2014-04-08 MED ORDER — MIDAZOLAM HCL 5 MG/5ML IJ SOLN
INTRAMUSCULAR | Status: AC
  Filled 2014-04-08: qty 10

## 2014-04-08 MED ORDER — SODIUM CHLORIDE 0.9 % IV SOLN
INTRAVENOUS | Status: DC
  Administered 2014-04-08: 1000 mL via INTRAVENOUS

## 2014-04-08 MED ORDER — STERILE WATER FOR IRRIGATION IR SOLN
Status: DC | PRN
  Administered 2014-04-08: 11:00:00

## 2014-04-08 MED ORDER — FENTANYL CITRATE 0.05 MG/ML IJ SOLN
INTRAMUSCULAR | Status: AC
  Filled 2014-04-08: qty 4

## 2014-04-08 MED ORDER — MIDAZOLAM HCL 5 MG/5ML IJ SOLN
INTRAMUSCULAR | Status: DC | PRN
  Administered 2014-04-08: 2 mg via INTRAVENOUS
  Administered 2014-04-08: 1 mg via INTRAVENOUS
  Administered 2014-04-08: 2 mg via INTRAVENOUS

## 2014-04-08 MED ORDER — FENTANYL CITRATE 0.05 MG/ML IJ SOLN
INTRAMUSCULAR | Status: DC | PRN
  Administered 2014-04-08 (×2): 25 ug via INTRAVENOUS

## 2014-04-08 NOTE — Op Note (Signed)
[facility] [address] [place], [address]   COLONOSCOPY PROCEDURE REPORT  PATIENT: Roger, Prince  MR#: [id number] BIRTHDATE: [birth year] , 89  yrs. old GENDER: male ENDOSCOPIST: Barney Drain, MD REFERRED CX:[id number] Moshe Cipro, M.D. PROCEDURE DATE:  2014-04-26 PROCEDURE:   Colonoscopy with cold biopsy polypectomy and Colonoscopy with snare polypectomy INDICATIONS:average risk for colon cancer. MEDICATIONS: Fentanyl 50 mcg IV and Versed 5 mg IV  DESCRIPTION OF PROCEDURE:    Physical exam was performed.  Informed consent was obtained from the patient after explaining the benefits, risks, and alternatives to procedure.  The patient was connected to monitor and placed in left lateral position. Continuous oxygen was provided by nasal cannula and IV medicine administered through an indwelling cannula.  After administration of sedation and rectal exam, the patients rectum was intubated and the EC-3890Li ([id number])  colonoscope was advanced under direct visualization to the cecum.  The scope was removed slowly by carefully examining the color, texture, anatomy, and integrity mucosa on the way out.  The patient was recovered in endoscopy and discharged home in satisfactory condition.    COLON FINDINGS: Six sessile polyps ranging from 3 to 60mm in size were found in the rectum, descending colon, and distal transverse colon.  A polypectomy was performed with cold forceps.  , A sessile polyp measuring 6 mm in size was found in the distal transverse colon.  A polypectomy was performed using snare cautery.  , The colon was redundant.  Manual abdominal counter-pressure was used to reach the cecum, and Moderate sized internal hemorrhoids were found.  PREP QUALITY: excellent.CECAL W/D TIME: 30 mins COMPLICATIONS: None  ENDOSCOPIC IMPRESSION: 1.   7 COLON POLYPS REMOVED 2.   MODERATE INTERNAL HEM,ORRHOIDS 3.   REDUNDANT L COLON  RECOMMENDATIONS: AWAIT BIOPSY HIGH FIBER  DIET LOSE WEIGHT TO BMI < 30 NEXT TCS IN 3-5 YRS.  CONSIDER AN OVERTUBE.      _______________________________ eSignedBarney Drain, MD 04/26/2014 4:42 PM    CPT CODES: ICD CODES:  The ICD and CPT codes recommended by this software are interpretations from the data that the clinical staff has captured with the software.  The verification of the translation of this report to the ICD and CPT codes and modifiers is the sole responsibility of the health care institution and practicing physician where this report was generated.  [employer] will not be held responsible for the validity of the ICD and CPT codes included on this report.  AMA assumes no liability for data contained or not contained herein. CPT is a Designer, television/film set of the [employer].

## 2014-04-08 NOTE — Discharge Instructions (Signed)
You had 7 polyps removed. You have small internal hemorrhoids and diverticulosis IN YOUR LEFT COLON.   FOLLOW A HIGH FIBER DIET. AVOID ITEMS THAT CAUSE BLOATING. SEE INFO BELOW.  YOUR BIOPSY WILL BE BACK IN 14 DAYS OR YOU CAN LOOK THEM UP ON MY CHART DEC 16.  Next colonoscopy in 3-5 years.   Colonoscopy Care After Read the instructions outlined below and refer to this sheet in the next week. These discharge instructions provide you with general information on caring for yourself after you leave the hospital. While your treatment has been planned according to the most current medical practices available, unavoidable complications occasionally occur. If you have any problems or questions after discharge, call DR. Marek Nghiem, [phone].  ACTIVITY  You may resume your regular activity, but move at a slower pace for the next 24 hours.   Take frequent rest periods for the next 24 hours.   Walking will help get rid of the air and reduce the bloated feeling in your belly (abdomen).   No driving for 24 hours (because of the medicine (anesthesia) used during the test).   You may shower.   Do not sign any important legal documents or operate any machinery for 24 hours (because of the anesthesia used during the test).    NUTRITION  Drink plenty of fluids.   You may resume your normal diet as instructed by your doctor.   Begin with a light meal and progress to your normal diet. Heavy or fried foods are harder to digest and may make you feel sick to your stomach (nauseated).   Avoid alcoholic beverages for 24 hours or as instructed.    MEDICATIONS  You may resume your normal medications.   WHAT YOU CAN EXPECT TODAY  Some feelings of bloating in the abdomen.   Passage of more gas than usual.   Spotting of blood in your stool or on the toilet paper  .  IF YOU HAD POLYPS REMOVED DURING THE COLONOSCOPY:  Eat a soft diet IF YOU HAVE NAUSEA, BLOATING, ABDOMINAL PAIN, OR  VOMITING.    FINDING OUT THE RESULTS OF YOUR TEST Not all test results are available during your visit. DR. Oneida Alar WILL CALL YOU WITHIN 7 DAYS OF YOUR PROCEDUE WITH YOUR RESULTS. Do not assume everything is normal if you have not heard from DR. Avyay Coger IN ONE WEEK, CALL HER OFFICE AT [phone].  SEEK IMMEDIATE MEDICAL ATTENTION AND CALL THE OFFICE: [phone] IF:  You have more than a spotting of blood in your stool.   Your belly is swollen (abdominal distention).   You are nauseated or vomiting.   You have a temperature over 101F.   You have abdominal pain or discomfort that is severe or gets worse throughout the day.  Polyps, Colon  A polyp is extra tissue that grows inside your body. Colon polyps grow in the large intestine. The large intestine, also called the colon, is part of your digestive system. It is a long, hollow tube at the end of your digestive tract where your body makes and stores stool. Most polyps are not dangerous. They are benign. This means they are not cancerous. But over time, some types of polyps can turn into cancer. Polyps that are smaller than a pea are usually not harmful. But larger polyps could someday become or may already be cancerous. To be safe, doctors remove all polyps and test them.   WHO GETS POLYPS? Anyone can get polyps, but certain people are more likely  than others. You may have a greater chance of getting polyps if:  You are over 50.   You have had polyps before.   Someone in your family has had polyps.   Someone in your family has had cancer of the large intestine.   Find out if someone in your family has had polyps. You may also be more likely to get polyps if you:   Eat a lot of fatty foods   Smoke   Drink alcohol   Do not exercise  Eat too much   TREATMENT  The caregiver will remove the polyp during sigmoidoscopy or colonoscopy.  PREVENTION There is not one sure way to prevent polyps. You might be able to lower your risk  of getting them if you:  Eat more fruits and vegetables and less fatty food.   Do not smoke.   Avoid alcohol.   Exercise every day.   Lose weight if you are overweight.   Eating more calcium and folate can also lower your risk of getting polyps. Some foods that are rich in calcium are milk, cheese, and broccoli. Some foods that are rich in folate are chickpeas, kidney beans, and spinach.   High-Fiber Diet A high-fiber diet changes your normal diet to include more whole grains, legumes, fruits, and vegetables. Changes in the diet involve replacing refined carbohydrates with unrefined foods. The calorie level of the diet is essentially unchanged. The Dietary Reference Intake (recommended amount) for adult males is 38 grams per day. For adult females, it is 25 grams per day. Pregnant and lactating women should consume 28 grams of fiber per day. Fiber is the intact part of a plant that is not broken down during digestion. Functional fiber is fiber that has been isolated from the plant to provide a beneficial effect in the body. PURPOSE  Increase stool bulk.   Ease and regulate bowel movements.   Lower cholesterol.  INDICATIONS THAT YOU NEED MORE FIBER  Constipation and hemorrhoids.   Uncomplicated diverticulosis (intestine condition) and irritable bowel syndrome.   Weight management.   As a protective measure against hardening of the arteries (atherosclerosis), diabetes, and cancer.   GUIDELINES FOR INCREASING FIBER IN THE DIET  Start adding fiber to the diet slowly. A gradual increase of about 5 more grams (2 slices of whole-wheat bread, 2 servings of most fruits or vegetables, or 1 bowl of high-fiber cereal) per day is best. Too rapid an increase in fiber may result in constipation, flatulence, and bloating.   Drink enough water and fluids to keep your urine clear or pale yellow. Water, juice, or caffeine-free drinks are recommended. Not drinking enough fluid may cause  constipation.   Eat a variety of high-fiber foods rather than one type of fiber.   Try to increase your intake of fiber through using high-fiber foods rather than fiber pills or supplements that contain small amounts of fiber.   The goal is to change the types of food eaten. Do not supplement your present diet with high-fiber foods, but replace foods in your present diet.  INCLUDE A VARIETY OF FIBER SOURCES  Replace refined and processed grains with whole grains, canned fruits with fresh fruits, and incorporate other fiber sources. White rice, white breads, and most bakery goods contain little or no fiber.   Brown whole-grain rice, buckwheat oats, and many fruits and vegetables are all good sources of fiber. These include: broccoli, Brussels sprouts, cabbage, cauliflower, beets, sweet potatoes, white potatoes (skin on), carrots, tomatoes, eggplant,  squash, berries, fresh fruits, and dried fruits.   Cereals appear to be the richest source of fiber. Cereal fiber is found in whole grains and bran. Bran is the fiber-rich outer coat of cereal grain, which is largely removed in refining. In whole-grain cereals, the bran remains. In breakfast cereals, the largest amount of fiber is found in those with "bran" in their names. The fiber content is sometimes indicated on the label.   You may need to include additional fruits and vegetables each day.   In baking, for 1 cup white flour, you may use the following substitutions:   1 cup whole-wheat flour minus 2 tablespoons.   1/2 cup white flour plus 1/2 cup whole-wheat flour.   Diverticulosis Diverticulosis is a common condition that develops when small pouches (diverticula) form in the wall of the colon. The risk of diverticulosis increases with age. It happens more often in people who eat a low-fiber diet. Most individuals with diverticulosis have no symptoms. Those individuals with symptoms usually experience belly (abdominal) pain, constipation, or  loose stools (diarrhea).  HOME CARE INSTRUCTIONS  Increase the amount of fiber in your diet as directed by your caregiver or dietician. This may reduce symptoms of diverticulosis.   Drink at least 6 to 8 glasses of water each day to prevent constipation.   Try not to strain when you have a bowel movement.   Avoiding nuts and seeds to prevent complications is still an uncertain benefit.       FOODS HAVING HIGH FIBER CONTENT INCLUDE:  Fruits. Apple, peach, pear, tangerine, raisins, prunes.   Vegetables. Brussels sprouts, asparagus, broccoli, cabbage, carrot, cauliflower, romaine lettuce, spinach, summer squash, tomato, winter squash, zucchini.   Starchy Vegetables. Baked beans, kidney beans, lima beans, split peas, lentils, potatoes (with skin).   Grains. Whole wheat bread, brown rice, bran flake cereal, plain oatmeal, white rice, shredded wheat, bran muffins.    SEEK IMMEDIATE MEDICAL CARE IF:  You develop increasing pain or severe bloating.   You have an oral temperature above 101F.   You develop vomiting or bowel movements that are bloody or black.   Hemorrhoids Hemorrhoids are dilated (enlarged) veins around the rectum. Sometimes clots will form in the veins. This makes them swollen and painful. These are called thrombosed hemorrhoids. Causes of hemorrhoids include:  Constipation.   Straining to have a bowel movement.   HEAVY LIFTING HOME CARE INSTRUCTIONS  Eat a well balanced diet and drink 6 to 8 glasses of water every day to avoid constipation. You may also use a bulk laxative.   Avoid straining to have bowel movements.   Keep anal area dry and clean.   Do not use a donut shaped pillow or sit on the toilet for long periods. This increases blood pooling and pain.   Move your bowels when your body has the urge; this will require less straining and will decrease pain and pressure.

## 2014-04-08 NOTE — H&P (Signed)
Primary Care Physician:  Tula Nakayama, MD Primary Gastroenterologist:  Dr. Oneida Alar  Pre-Procedure History & Physical: HPI:  Roger Prince is a 53 y.o. male here for [place].  Past Medical History  Diagnosis Date  . Hypertension   . Diabetes mellitus without complication     History reviewed. No pertinent past surgical history.  Prior to Admission medications   Medication Sig Start Date End Date Taking? Authorizing Provider  aspirin EC 81 MG tablet Take 1 tablet (81 mg total) by mouth daily. 10/06/13  Yes Fayrene Helper, MD  benazepril (LOTENSIN) 20 MG tablet TAKE ONE TABLET BY MOUTH ONCE DAILY 01/04/14  Yes Fayrene Helper, MD  lovastatin (MEVACOR) 40 MG tablet Take 1 tablet (40 mg total) by mouth at bedtime. 10/15/13  Yes Fayrene Helper, MD  polyethylene glycol-electrolytes (TRILYTE) 420 G solution Take 4,000 mLs by mouth as directed. 03/17/14  Yes Danie Binder, MD  triamterene-hydrochlorothiazide (MAXZIDE-25) 37.5-25 MG per tablet Take 1 tablet by mouth daily. 03/16/13 03/16/14 Yes Fayrene Helper, MD  cyclobenzaprine (FLEXERIL) 10 MG tablet Take 1 tablet (10 mg total) by mouth 2 (two) times daily as needed for muscle spasms. Patient not taking: Reported on 03/11/2014 04/29/13   Shaune Pollack, MD  peg 3350 powder (MOVIPREP) 100 G SOLR Take 1 kit (200 g total) by mouth as directed. 03/16/14   Danie Binder, MD    Allergies as of 03/07/2014 - Review Complete 03/07/2014  Allergen Reaction Noted  . Meperidine hcl Other (See Comments) 11/30/2008    Family History  Problem Relation Age of Onset  . Congestive Heart Failure Father     History   Social History  . Marital Status: Married    Spouse Name: N/A    Number of Children: N/A  . Years of Education: N/A   Occupational History  . Not on file.   Social History Main Topics  . Smoking status: Former Smoker -- 2 years    Types: Cigars  . Smokeless tobacco: Never Used  . Alcohol Use: No  . Drug  Use: No  . Sexual Activity: Not on file   Other Topics Concern  . Not on file   Social History Narrative    Review of Systems: See HPI, otherwise negative ROS   Physical Exam: BP 130/93 mmHg  Pulse 108  Temp(Src) 98.1 F (36.7 C) (Oral)  Resp 17  Ht 5' 9"  (1.753 m)  Wt 237 lb (107.502 kg)  BMI 34.98 kg/m2  SpO2 95% General:   Alert,  pleasant and cooperative in NAD Head:  Normocephalic and atraumatic. Neck:  Supple; Lungs:  Clear throughout to auscultation.    Heart:  Regular rate and rhythm. Abdomen:  Soft, nontender and nondistended. Normal bowel sounds, without guarding, and without rebound.   Neurologic:  Alert and  oriented x4;  grossly normal neurologically.  Impression/Plan:   SCREENING  Plan:  1. TCS TODAY

## 2014-04-11 ENCOUNTER — Other Ambulatory Visit: Payer: Self-pay | Admitting: Family Medicine

## 2014-04-11 ENCOUNTER — Encounter (HOSPITAL_COMMUNITY): Payer: Self-pay | Admitting: Gastroenterology

## 2014-04-11 ENCOUNTER — Encounter: Payer: Self-pay | Admitting: Family Medicine

## 2014-04-11 DIAGNOSIS — D126 Benign neoplasm of colon, unspecified: Secondary | ICD-10-CM | POA: Insufficient documentation

## 2014-04-14 ENCOUNTER — Ambulatory Visit: Payer: BC Managed Care – PPO | Admitting: Family Medicine

## 2014-05-02 ENCOUNTER — Telehealth: Payer: Self-pay | Admitting: Gastroenterology

## 2014-05-02 NOTE — Telephone Encounter (Signed)
Please call pt. HE had FOUR simple adenomas removed. 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   FOLLOW A HIGH FIBER DIET. AVOID ITEMS THAT CAUSE BLOATING.   Next colonoscopy in 3 years WITH AN OVERTUBE/FENTANYL

## 2014-05-02 NOTE — Telephone Encounter (Signed)
PATIENT HAD COLONOSCOPY EARLY December AND HAS NOT HEARD ABOUT RESULTS.  PLEASE CALL

## 2014-05-02 NOTE — Telephone Encounter (Signed)
Pt is aware of results. 

## 2014-05-02 NOTE — Telephone Encounter (Signed)
Routing to Dr. Fields.  

## 2014-05-03 NOTE — Telephone Encounter (Signed)
Reminder in epic °

## 2014-05-12 ENCOUNTER — Other Ambulatory Visit: Payer: Self-pay | Admitting: Family Medicine

## 2014-05-18 ENCOUNTER — Ambulatory Visit: Payer: BC Managed Care – PPO | Admitting: Family Medicine

## 2014-06-07 ENCOUNTER — Ambulatory Visit: Payer: Self-pay | Admitting: Family Medicine

## 2014-06-14 ENCOUNTER — Encounter: Payer: Self-pay | Admitting: Family Medicine

## 2014-06-14 ENCOUNTER — Ambulatory Visit (INDEPENDENT_AMBULATORY_CARE_PROVIDER_SITE_OTHER): Payer: BLUE CROSS/BLUE SHIELD | Admitting: Family Medicine

## 2014-06-14 ENCOUNTER — Other Ambulatory Visit: Payer: Self-pay | Admitting: Family Medicine

## 2014-06-14 VITALS — BP 120/84 | HR 86 | Resp 18 | Ht 69.0 in | Wt 268.0 lb

## 2014-06-14 DIAGNOSIS — R0789 Other chest pain: Secondary | ICD-10-CM

## 2014-06-14 DIAGNOSIS — E119 Type 2 diabetes mellitus without complications: Secondary | ICD-10-CM

## 2014-06-14 DIAGNOSIS — IMO0001 Reserved for inherently not codable concepts without codable children: Secondary | ICD-10-CM

## 2014-06-14 DIAGNOSIS — Z125 Encounter for screening for malignant neoplasm of prostate: Secondary | ICD-10-CM

## 2014-06-14 DIAGNOSIS — E785 Hyperlipidemia, unspecified: Secondary | ICD-10-CM

## 2014-06-14 DIAGNOSIS — I10 Essential (primary) hypertension: Secondary | ICD-10-CM

## 2014-06-14 DIAGNOSIS — J302 Other seasonal allergic rhinitis: Secondary | ICD-10-CM

## 2014-06-14 DIAGNOSIS — R1013 Epigastric pain: Secondary | ICD-10-CM

## 2014-06-14 DIAGNOSIS — R079 Chest pain, unspecified: Secondary | ICD-10-CM

## 2014-06-14 HISTORY — DX: Chest pain, unspecified: R07.9

## 2014-06-14 MED ORDER — MOMETASONE FUROATE 50 MCG/ACT NA SUSP: 2.0000 | Freq: Every day | NASAL | Status: DC

## 2014-06-14 NOTE — Progress Notes (Signed)
Subjective:    Patient ID: Roger Prince, male    DOB: 11/14/1960, 54 y.o.   MRN: [id number]  HPI  The PT is here for follow up and re-evaluation of chronic medical conditions, medication management and review of any available recent lab and radiology data.  Preventive health is updated, specifically  Cancer screening and Immunization.   Questions or concerns regarding consultations or procedures which the PT has had in the interim are  Addressed.Had colonoscopy, multiple abnormal polyps therefore close surveillance needed The PT has stopped metformin for approx 3 months, c/o gI disturbance Denies polyuria, polydipsia, blurred vision  3 week h/o excess nasal congestion and clear drainage , no fever or chills  C/o intermittent chest discomfort, mainly associated with emotional stress, but concerned re heart disease as  he has a friend recently died of MI his age, is at inc heart disease risk due to chronic illnesses. No know f/h of CAD       Review of Systems See HPI Denies recent fever or chills. Denies  ear pain or sore throat. Denies chest congestion, productive cough or wheezing. Denies PND, orthopnea, palpitations and leg swelling Denies abdominal pain, nausea, vomiting,diarrhea or constipation.   Denies dysuria, frequency, hesitancy or incontinence. Denies joint pain, swelling and limitation in mobility. Denies headaches, seizures, numbness, or tingling. Denies depression, does have mild  anxiety denies  insomnia. Denies skin break down or rash.        Objective:   Physical Exam BP 120/84 mmHg  Pulse 86  Resp 18  Ht 5\' 9"  (1.753 m)  Wt 268 lb (121.564 kg)  BMI 39.56 kg/m2  SpO2 95% Patient alert and oriented and in no cardiopulmonary distress.  HEENT: No facial asymmetry, EOMI,   oropharynx pink and moist.  Neck supple no JVD, no mass.mild erythema and edema of nasal mucosa Chest: Clear to auscultation bilaterally.  CVS: S1, S2 no murmurs, no S3.Regular  rate.  ABD: Soft non tender.   Ext: No edema  MS: Adequate ROM spine, shoulders, hips and knees.  Skin: Intact, no ulcerations or rash noted.  Psych: Good eye contact, normal affect. Memory intact not anxious or depressed appearing.  CNS: CN 2-12 intact, power,  normal throughout.no focal deficits noted.        Assessment & Plan:  Essential hypertension Controlled, no change in medication DASH diet and commitment to daily physical activity for a minimum of 30 minutes discussed and encouraged, as a part of hypertension management. The importance of attaining a healthy weight is also discussed.    Seasonal allergies Uncontrolled x 3 weeks start daily nasonex and saline nasal flushes   DM type 2, goal A1c below 7 Updated lab needed  Has been off metformin for approx 2 to 3 months, c/o gI upset Advised if HBa1C less than 7 will try diet control only , though avised of benefit of metformin .     Hyperlipidemia with target LDL less than 100 Hyperlipidemia:Low fat diet discussed and encouraged.  Updated lab needed    Obesity, Class II, BMI 35-39.9, with comorbidity Deteriorated. Patient re-educated about  the importance of commitment to a  minimum of 150 minutes of exercise per week. The importance of healthy food choices with portion control discussed. Encouraged to start a food diary, count calories and to consider  joining a support group. Sample diet sheets offered. Goals set by the patient for the next several months.      Atypical chest pain Multiple CV  risk factors Recent episodes of intermittent chest discomfort, refer to cardiology for eval

## 2014-06-14 NOTE — Assessment & Plan Note (Signed)
Updated lab needed  Has been off metformin for approx 2 to 3 months, c/o gI upset Advised if HBa1C less than 7 will try diet control only , though avised of benefit of metformin .

## 2014-06-14 NOTE — Assessment & Plan Note (Signed)
Multiple CV risk factors Recent episodes of intermittent chest discomfort, refer to cardiology for eval

## 2014-06-14 NOTE — Assessment & Plan Note (Signed)
Uncontrolled x 3 weeks start daily nasonex and saline nasal flushes

## 2014-06-14 NOTE — Patient Instructions (Addendum)
F/u in 3 month, call if you need me before  You are referred to cardiology for evalaution  Of intermittent chest pain, and because you are at increased risk for heart disease  Pls sign for eye exam     Labs tomorrow pls fasting lipid, cmp and EGFr, HBA1C, PSa, cBc, TSH and H pylori  Microalb from office today  New for allergies is daily nasal spray which is prescribed   Also do saline nasal flushes

## 2014-06-14 NOTE — Assessment & Plan Note (Signed)
Controlled, no change in medication DASH diet and commitment to daily physical activity for a minimum of 30 minutes discussed and encouraged, as a part of hypertension management. The importance of attaining a healthy weight is also discussed.  

## 2014-06-14 NOTE — Assessment & Plan Note (Signed)
Hyperlipidemia:Low fat diet discussed and encouraged.  Updated lab needed 

## 2014-06-14 NOTE — Assessment & Plan Note (Signed)
Deteriorated. Patient re-educated about  the importance of commitment to a  minimum of 150 minutes of exercise per week. The importance of healthy food choices with portion control discussed. Encouraged to start a food diary, count calories and to consider  joining a support group. Sample diet sheets offered. Goals set by the patient for the next several months.    

## 2014-06-15 LAB — CBC
HCT: 43.1 % (ref 39.0–52.0)
Hemoglobin: 14.7 g/dL (ref 13.0–17.0)
MCH: 27.8 pg (ref 26.0–34.0)
MCHC: 34.1 g/dL (ref 30.0–36.0)
MCV: 81.5 fL (ref 78.0–100.0)
MPV: 10 fL (ref 8.6–12.4)
Platelets: 174 10*3/uL (ref 150–400)
RBC: 5.29 MIL/uL (ref 4.22–5.81)
RDW: 14.2 % (ref 11.5–15.5)
WBC: 6.4 10*3/uL (ref 4.0–10.5)

## 2014-06-16 ENCOUNTER — Other Ambulatory Visit: Payer: Self-pay

## 2014-06-16 ENCOUNTER — Other Ambulatory Visit: Payer: Self-pay | Admitting: Family Medicine

## 2014-06-16 LAB — COMPLETE METABOLIC PANEL WITH GFR
ALT: 20 U/L (ref 0–53)
AST: 23 U/L (ref 0–37)
Albumin: 4.2 g/dL (ref 3.5–5.2)
Alkaline Phosphatase: 63 U/L (ref 39–117)
BUN: 11 mg/dL (ref 6–23)
CO2: 24 mEq/L (ref 19–32)
Calcium: 9.6 mg/dL (ref 8.4–10.5)
Chloride: 103 mEq/L (ref 96–112)
Creat: 1.01 mg/dL (ref 0.50–1.35)
GFR, Est African American: >89 mL/min
GFR, Est Non African American: 85 mL/min
Glucose, Bld: 97 mg/dL (ref 70–99)
Potassium: 3.9 mEq/L (ref 3.5–5.3)
Sodium: 138 mEq/L (ref 135–145)
Total Bilirubin: 0.7 mg/dL (ref 0.2–1.2)
Total Protein: 7.1 g/dL (ref 6.0–8.3)

## 2014-06-16 LAB — LIPID PANEL
Cholesterol: 185 mg/dL (ref 0–200)
HDL: 39 mg/dL — ABNORMAL LOW (ref >39)
LDL Cholesterol: 116 mg/dL — ABNORMAL HIGH (ref 0–99)
Total CHOL/HDL Ratio: 4.7 Ratio
Triglycerides: 151 mg/dL — ABNORMAL HIGH (ref <150)
VLDL: 30 mg/dL (ref 0–40)

## 2014-06-16 LAB — MICROALBUMIN / CREATININE URINE RATIO
Creatinine, Urine: 157.7 mg/dL
Microalb Creat Ratio: 1.9 mg/g (ref 0.0–30.0)
Microalb, Ur: 0.3 mg/dL (ref <2.0)

## 2014-06-16 LAB — PSA: PSA: 0.58 ng/mL (ref <=4.00)

## 2014-06-16 LAB — TSH: TSH: 1.045 u[IU]/mL (ref 0.350–4.500)

## 2014-06-16 LAB — HEMOGLOBIN A1C
Hgb A1c MFr Bld: 6.9 % — ABNORMAL HIGH (ref <5.7)
Mean Plasma Glucose: 151 mg/dL — ABNORMAL HIGH (ref <117)

## 2014-06-16 MED ORDER — BENAZEPRIL HCL 20 MG PO TABS: 20.0000 mg | ORAL_TABLET | Freq: Every day | ORAL | Status: DC

## 2014-06-16 MED ORDER — TRIAMTERENE-HCTZ 37.5-25 MG PO TABS: 1.0000 | ORAL_TABLET | Freq: Every day | ORAL | Status: DC

## 2014-06-27 NOTE — Addendum Note (Signed)
Addended by: Denman George B on: 06/27/2014 04:41 PM   Modules accepted: Orders

## 2014-07-11 ENCOUNTER — Ambulatory Visit: Payer: BLUE CROSS/BLUE SHIELD | Admitting: Cardiovascular Disease

## 2014-07-11 ENCOUNTER — Encounter: Payer: Self-pay | Admitting: Cardiovascular Disease

## 2014-07-15 ENCOUNTER — Other Ambulatory Visit: Payer: Self-pay | Admitting: Family Medicine

## 2014-08-08 ENCOUNTER — Encounter: Payer: Self-pay | Admitting: Cardiovascular Disease

## 2014-08-08 ENCOUNTER — Ambulatory Visit: Payer: BLUE CROSS/BLUE SHIELD | Admitting: Cardiovascular Disease

## 2014-08-12 ENCOUNTER — Other Ambulatory Visit: Payer: Self-pay | Admitting: Family Medicine

## 2014-08-29 ENCOUNTER — Encounter: Payer: Self-pay | Admitting: Family Medicine

## 2014-08-29 ENCOUNTER — Ambulatory Visit (INDEPENDENT_AMBULATORY_CARE_PROVIDER_SITE_OTHER): Payer: BLUE CROSS/BLUE SHIELD | Admitting: Family Medicine

## 2014-08-29 VITALS — BP 120/84 | HR 99 | Resp 16 | Ht 69.0 in | Wt 266.0 lb

## 2014-08-29 DIAGNOSIS — E785 Hyperlipidemia, unspecified: Secondary | ICD-10-CM | POA: Diagnosis not present

## 2014-08-29 DIAGNOSIS — I10 Essential (primary) hypertension: Secondary | ICD-10-CM | POA: Diagnosis not present

## 2014-08-29 DIAGNOSIS — G4730 Sleep apnea, unspecified: Secondary | ICD-10-CM

## 2014-08-29 DIAGNOSIS — E119 Type 2 diabetes mellitus without complications: Secondary | ICD-10-CM

## 2014-08-29 DIAGNOSIS — R0789 Other chest pain: Secondary | ICD-10-CM

## 2014-08-29 MED ORDER — TRIAMTERENE-HCTZ 37.5-25 MG PO TABS: 1.0000 | ORAL_TABLET | Freq: Every day | ORAL | Status: DC

## 2014-08-29 MED ORDER — BENAZEPRIL HCL 20 MG PO TABS: 20.0000 mg | ORAL_TABLET | Freq: Every day | ORAL | Status: DC

## 2014-08-29 MED ORDER — METFORMIN HCL ER (OSM) 500 MG PO TB24: ORAL_TABLET | ORAL | Status: DC

## 2014-08-29 MED ORDER — LOVASTATIN 20 MG PO TABS: 20.0000 mg | ORAL_TABLET | Freq: Every day | ORAL | Status: DC

## 2014-08-29 NOTE — Progress Notes (Signed)
Subjective:    Patient ID: Roger Prince, male    DOB: 03-20-1961, 54 y.o.   MRN: [id number]  HPI The PT is here for follow up and re-evaluation of chronic medical conditions, medication management and review of any available recent lab and radiology data.  Preventive health is updated, specifically  Cancer screening and Immunization.   Questions or concerns regarding consultations or procedures which the PT has had in the interim are  Addressed.Unfortunately , has not seen cardiology as yet, needs to re schedule The PT denies any adverse reactions to current medications since the last visit.  There are no new concerns.  There are no specific complaints  Denies polyuria, polydipsia, blurred vision , or hypoglycemic episodes.       Review of Systems See HPI Denies recent fever or chills. Denies sinus pressure, nasal congestion, ear pain or sore throat. Denies chest congestion, productive cough or wheezing. Denies chest pains, palpitations and leg swelling Denies abdominal pain, nausea, vomiting,diarrhea or constipation.   Denies dysuria, frequency, hesitancy or incontinence. Denies joint pain, swelling and limitation in mobility. C/o headches, fatigue , excess snoring, denies seizures, numbness, or tingling. Denies depression, anxiety or insomnia. Denies skin break down or rash.        Objective:   Physical Exam  BP 120/84 mmHg  Pulse 99  Resp 16  Ht 5\' 9"  (1.753 m)  Wt 266 lb (120.657 kg)  BMI 39.26 kg/m2  SpO2 97% Patient alert and oriented and in no cardiopulmonary distress.  HEENT: No facial asymmetry, EOMI,   oropharynx pink and moist.  Neck supple no JVD, no mass.  Chest: Clear to auscultation bilaterally.  CVS: S1, S2 no murmurs, no S3.Regular rate.  ABD: Soft non tender.   Ext: No edema  MS: Adequate ROM spine, shoulders, hips and knees.  Skin: Intact, no ulcerations or rash noted.  Psych: Good eye contact, normal affect. Memory intact not anxious or  depressed appearing.  CNS: CN 2-12 intact, power,  normal throughout.no focal deficits noted.       Essential hypertension Controlled, no change in medication DASH diet and commitment to daily physical activity for a minimum of 30 minutes discussed and encouraged, as a part of hypertension management. The importance of attaining a healthy weight is also discussed.  BP/Weight 08/29/2014 06/14/2014 04/08/2014 10/06/2013 05/02/2013 04/29/2013 20/25/4270  Systolic BP 623 762 831 517 616 073 710  Diastolic BP 84 84 96 82 80 93 90  Wt. (Lbs) 266 268 237 262 257 257 264.12  BMI 39.26 39.56 34.98 38.67 37.93 37.93 38.46         DM type 2, goal A1c below 7 Controlled, no change in medication Patient educated about the importance of limiting  Carbohydrate intake , the need to commit to daily physical activity for a minimum of 30 minutes , and to commit weight loss. The fact that changes in all these areas will reduce or eliminate all together the development of diabetes is stressed.   Diabetic Labs Latest Ref Rng 06/14/2014 10/09/2013 05/02/2013 04/29/2013 03/16/2013  HbA1c <5.7 % 6.9(H) 6.8(H) - - 6.7(H)  Microalbumin <2.0 mg/dL 0.3 - - - 0.50  Micro/Creat Ratio 0.0 - 30.0 mg/g 1.9 - - - 3.4  Chol 0 - 200 mg/dL 185 190 - - 206(H)  HDL >39 mg/dL 39(L) 42 - - 39(L)  Calc LDL 0 - 99 mg/dL 116(H) 120(H) - - 126(H)  Triglycerides <150 mg/dL 151(H) 138 - - 203(H)  Creatinine 0.50 -  1.35 mg/dL 1.01 1.26 1.16 1.31 1.13   BP/Weight 08/29/2014 06/14/2014 04/08/2014 10/06/2013 05/02/2013 04/29/2013 62/13/0865  Systolic BP 784 696 295 284 132 440 102  Diastolic BP 84 84 96 82 80 93 90  Wt. (Lbs) 266 268 237 262 257 257 264.12  BMI 39.26 39.56 34.98 38.67 37.93 37.93 38.46   Foot/eye exam completion dates 06/14/2014 03/16/2013  Foot Form Completion Done Done        Sleep apnea H/o excessive snoring  and chronic fatigue, needs sleep study , he agrees to get this and referral is entered  Encouraged to work  o weight loss.  Educated re the importance of having this treated   Morbid obesity Unchanged Patient re-educated about  the importance of commitment to a  minimum of 150 minutes of exercise per week.  The importance of healthy food choices with portion control discussed. Encouraged to start a food diary, count calories and to consider  joining a support group. Sample diet sheets offered. Goals set by the patient for the next several months.   Weight /BMI 08/29/2014 06/14/2014 04/08/2014  WEIGHT 266 lb 268 lb 237 lb  HEIGHT 5\' 9"  5\' 9"  5\' 9"   BMI 39.26 kg/m2 39.56 kg/m2 34.98 kg/m2    Current exercise per week 90 minutes.    Hyperlipidemia with target LDL less than 100 Uncontrolled Hyperlipidemia:Low fat diet discussed and encouraged.   Lipid Panel  Lab Results  Component Value Date   CHOL 185 06/14/2014   HDL 39* 06/14/2014   LDLCALC 116* 06/14/2014   TRIG 151* 06/14/2014   CHOLHDL 4.7 06/14/2014      Updated lab needed at/ before next visit.    Atypical chest pain Re educated re the need to f/u with cardiology, has multiple CV risk factors      .priobap

## 2014-08-29 NOTE — Patient Instructions (Addendum)
F/u early October, call if yopu need me before please  Fasting lipid, cmp and EGFR, HBA1C June 16 or shortly after  Please call and reschedule your cardiology appt for soonest available, you NEED this  Lower dose of lovastatin 20mg daily, try taking in the morning if tolerated  Better then  Metformin 500mg one daily OK to take any time of day with or without food  Continue daily exercise and work on food choice and portion size so you lose weight  You are referred to Dr [place]on[place] for sleep study  Happy b/day in 3 days!  Thanks for choosing [facility], we consider it a privelige to serve you.      

## 2014-09-18 NOTE — Assessment & Plan Note (Signed)
Uncontrolled Hyperlipidemia:Low fat diet discussed and encouraged.   Lipid Panel  Lab Results  Component Value Date   CHOL 185 06/14/2014   HDL 39* 06/14/2014   LDLCALC 116* 06/14/2014   TRIG 151* 06/14/2014   CHOLHDL 4.7 06/14/2014      Updated lab needed at/ before next visit.

## 2014-09-18 NOTE — Assessment & Plan Note (Signed)
Re educated re the need to f/u with cardiology, has multiple CV risk factors

## 2014-09-18 NOTE — Assessment & Plan Note (Signed)
Controlled, no change in medication Patient educated about the importance of limiting  Carbohydrate intake , the need to commit to daily physical activity for a minimum of 30 minutes , and to commit weight loss. The fact that changes in all these areas will reduce or eliminate all together the development of diabetes is stressed.   Diabetic Labs Latest Ref Rng 06/14/2014 10/09/2013 05/02/2013 04/29/2013 03/16/2013  HbA1c <5.7 % 6.9(H) 6.8(H) - - 6.7(H)  Microalbumin <2.0 mg/dL 0.3 - - - 0.50  Micro/Creat Ratio 0.0 - 30.0 mg/g 1.9 - - - 3.4  Chol 0 - 200 mg/dL 185 190 - - 206(H)  HDL >39 mg/dL 39(L) 42 - - 39(L)  Calc LDL 0 - 99 mg/dL 116(H) 120(H) - - 126(H)  Triglycerides <150 mg/dL 151(H) 138 - - 203(H)  Creatinine 0.50 - 1.35 mg/dL 1.01 1.26 1.16 1.31 1.13   BP/Weight 08/29/2014 06/14/2014 04/08/2014 10/06/2013 05/02/2013 04/29/2013 32/76/1470  Systolic BP 929 574 734 037 096 438 381  Diastolic BP 84 84 96 82 80 93 90  Wt. (Lbs) 266 268 237 262 257 257 264.12  BMI 39.26 39.56 34.98 38.67 37.93 37.93 38.46   Foot/eye exam completion dates 06/14/2014 03/16/2013  Foot Form Completion Done Done

## 2014-09-18 NOTE — Assessment & Plan Note (Signed)
Controlled, no change in medication DASH diet and commitment to daily physical activity for a minimum of 30 minutes discussed and encouraged, as a part of hypertension management. The importance of attaining a healthy weight is also discussed.  BP/Weight 08/29/2014 06/14/2014 04/08/2014 10/06/2013 05/02/2013 04/29/2013 87/57/9728  Systolic BP 206 015 615 379 432 761 470  Diastolic BP 84 84 96 82 80 93 90  Wt. (Lbs) 266 268 237 262 257 257 264.12  BMI 39.26 39.56 34.98 38.67 37.93 37.93 38.46

## 2014-09-18 NOTE — Assessment & Plan Note (Signed)
H/o excessive snoring  and chronic fatigue, needs sleep study , he agrees to get this and referral is entered  Encouraged to work o weight loss.  Educated re the importance of having this treated

## 2014-09-18 NOTE — Assessment & Plan Note (Signed)
Unchanged Patient re-educated about  the importance of commitment to a  minimum of 150 minutes of exercise per week.  The importance of healthy food choices with portion control discussed. Encouraged to start a food diary, count calories and to consider  joining a support group. Sample diet sheets offered. Goals set by the patient for the next several months.   Weight /BMI 08/29/2014 06/14/2014 04/08/2014  WEIGHT 266 lb 268 lb 237 lb  HEIGHT 5\' 9"  5\' 9"  5\' 9"   BMI 39.26 kg/m2 39.56 kg/m2 34.98 kg/m2    Current exercise per week 90 minutes.

## 2014-12-27 ENCOUNTER — Encounter: Payer: Self-pay | Admitting: *Deleted

## 2015-01-18 ENCOUNTER — Ambulatory Visit: Payer: BLUE CROSS/BLUE SHIELD | Admitting: Family Medicine

## 2015-02-02 ENCOUNTER — Ambulatory Visit: Payer: BLUE CROSS/BLUE SHIELD | Admitting: Family Medicine

## 2015-02-23 ENCOUNTER — Other Ambulatory Visit: Payer: Self-pay | Admitting: Family Medicine

## 2015-02-27 ENCOUNTER — Other Ambulatory Visit: Payer: Self-pay | Admitting: Family Medicine

## 2015-02-27 LAB — COMPLETE METABOLIC PANEL WITH GFR
ALT: 18 U/L (ref 9–46)
AST: 23 U/L (ref 10–35)
Albumin: 4.2 g/dL (ref 3.6–5.1)
Alkaline Phosphatase: 55 U/L (ref 40–115)
BUN: 11 mg/dL (ref 7–25)
CO2: 27 mmol/L (ref 20–31)
Calcium: 9.5 mg/dL (ref 8.6–10.3)
Chloride: 99 mmol/L (ref 98–110)
Creat: 1.08 mg/dL (ref 0.70–1.33)
GFR, Est African American: >89 mL/min (ref >=60)
GFR, Est Non African American: 78 mL/min (ref >=60)
Glucose, Bld: 107 mg/dL — ABNORMAL HIGH (ref 65–99)
Potassium: 4.3 mmol/L (ref 3.5–5.3)
Sodium: 137 mmol/L (ref 135–146)
Total Bilirubin: 0.7 mg/dL (ref 0.2–1.2)
Total Protein: 7 g/dL (ref 6.1–8.1)

## 2015-02-27 LAB — LIPID PANEL
Cholesterol: 164 mg/dL (ref 125–200)
HDL: 38 mg/dL — ABNORMAL LOW (ref >=40)
LDL Cholesterol: 98 mg/dL (ref <130)
Total CHOL/HDL Ratio: 4.3 Ratio (ref <=5.0)
Triglycerides: 140 mg/dL (ref <150)
VLDL: 28 mg/dL (ref <30)

## 2015-02-27 LAB — HEMOGLOBIN A1C
Hgb A1c MFr Bld: 6.6 % — ABNORMAL HIGH (ref <5.7)
Mean Plasma Glucose: 143 mg/dL — ABNORMAL HIGH (ref <117)

## 2015-03-02 ENCOUNTER — Ambulatory Visit (INDEPENDENT_AMBULATORY_CARE_PROVIDER_SITE_OTHER): Payer: BLUE CROSS/BLUE SHIELD | Admitting: Family Medicine

## 2015-03-02 ENCOUNTER — Encounter: Payer: Self-pay | Admitting: Family Medicine

## 2015-03-02 VITALS — BP 118/80 | HR 99 | Resp 16 | Ht 69.0 in | Wt 254.0 lb

## 2015-03-02 DIAGNOSIS — E785 Hyperlipidemia, unspecified: Secondary | ICD-10-CM

## 2015-03-02 DIAGNOSIS — Z23 Encounter for immunization: Secondary | ICD-10-CM

## 2015-03-02 DIAGNOSIS — N521 Erectile dysfunction due to diseases classified elsewhere: Secondary | ICD-10-CM | POA: Diagnosis not present

## 2015-03-02 DIAGNOSIS — I10 Essential (primary) hypertension: Secondary | ICD-10-CM

## 2015-03-02 DIAGNOSIS — E119 Type 2 diabetes mellitus without complications: Secondary | ICD-10-CM | POA: Diagnosis not present

## 2015-03-02 DIAGNOSIS — Z125 Encounter for screening for malignant neoplasm of prostate: Secondary | ICD-10-CM

## 2015-03-02 LAB — HEPATITIS C ANTIBODY: HCV Ab: NEGATIVE

## 2015-03-02 LAB — HIV ANTIBODY (ROUTINE TESTING W REFLEX): HIV 1&2 Ab, 4th Generation: NONREACTIVE

## 2015-03-02 LAB — HEMOGLOBIN A1C
A1c: 6.6
LDL: 98

## 2015-03-02 MED ORDER — SILDENAFIL CITRATE 100 MG PO TABS: 50.0000 mg | ORAL_TABLET | Freq: Every day | ORAL | Status: DC | PRN

## 2015-03-02 NOTE — Patient Instructions (Signed)
F/u in 5 month, call if you need me before  CONGRATS on improved health habits and improved health  Flu vaccine today  12 to 15 pound weight loss You are referred for eye exam  Coupon for viagra if available, call back if too expensive please  Please work on good  health habits so that your health will improve. 1. Commitment to daily physical activity for 30 to 60  minutes, if you are able to do this.  2. Commitment to wise food choices. Aim for half of your  food intake to be vegetable and fruit, one quarter starchy foods, and one quarter protein. Try to eat on a regular schedule  3 meals per day, snacking between meals should be limited to vegetables or fruits or small portions of nuts. 64 ounces of water per day is generally recommended, unless you have specific health conditions, like heart failure or kidney failure where you will need to limit fluid intake.  3. Commitment to sufficient and a  good quality of physical and mental rest daily, generally between 6 to 8 hours per day.  WITH PERSISTANCE AND PERSEVERANCE, THE IMPOSSIBLE , BECOMES THE NORM!  Thanks for choosing [facility], we consider it a privelige to serve you. Fasting labs and urine Feb  20 or after

## 2015-03-02 NOTE — Progress Notes (Signed)
Subjective:    Patient ID: Roger Prince, male    DOB: Nov 23, 1960, 54 y.o.   MRN: [id number]  [place]     MRN: [id number]      DOB: [birth year]   HPI Roger Prince is here for follow up and re-evaluation of chronic medical conditions, medication management and review of any available recent lab and radiology data.  Preventive health is updated, specifically  Cancer screening and Immunization.   Questions or concerns regarding consultations or procedures which the PT has had in the interim are  addressed. The PT denies any adverse reactions to current medications since the last visit.  There are no new concerns.  There are no specific complaints   ROS Denies recent fever or chills. Denies sinus pressure, nasal congestion, ear pain or sore throat. Denies chest congestion, productive cough or wheezing. Denies chest pains, palpitations and leg swelling Denies abdominal pain, nausea, vomiting,diarrhea or constipation.   Denies dysuria, frequency, hesitancy or incontinence. Denies joint pain, swelling and limitation in mobility. Denies headaches, seizures, numbness, or tingling. Denies depression, anxiety or insomnia. Denies skin break down or rash.   PE  BP 118/80 mmHg  Pulse 99  Resp 16  Ht 5\' 9"  (1.753 m)  Wt 254 lb (115.214 kg)  BMI 37.49 kg/m2  SpO2 97%  Patient alert and oriented and in no cardiopulmonary distress.  HEENT: No facial asymmetry, EOMI,   oropharynx pink and moist.  Neck supple no JVD, no mass.  Chest: Clear to auscultation bilaterally.  CVS: S1, S2 no murmurs, no S3.Regular rate.  ABD: Soft non tender.   Ext: No edema  MS: Adequate ROM spine, shoulders, hips and knees.  Skin: Intact, no ulcerations or rash noted.  Psych: Good eye contact, normal affect. Memory intact not anxious or depressed appearing.  CNS: CN 2-12 intact, power,  normal throughout.no focal deficits noted.   Assessment & Plan   DM type 2, goal A1c below 7 Controlled, no  change in medication Roger Prince is reminded of the importance of commitment to daily physical activity for 30 minutes or more, as able and the need to limit carbohydrate intake to 30 to 60 grams per meal to help with blood sugar control.   The need to take medication as prescribed, test blood sugar as directed, and to call between visits if there is a concern that blood sugar is uncontrolled is also discussed.   Roger Prince is reminded of the importance of daily foot exam, annual eye examination, and good blood sugar, blood pressure and cholesterol control.  Diabetic Labs Latest Ref Rng 06/14/2014 10/09/2013 05/02/2013 04/29/2013 03/16/2013  HbA1c <5.7 % 6.9(H) 6.8(H) - - 6.7(H)  Microalbumin <2.0 mg/dL 0.3 - - - 0.50  Micro/Creat Ratio 0.0 - 30.0 mg/g 1.9 - - - 3.4  Chol 0 - 200 mg/dL 185 190 - - 206(H)  HDL >39 mg/dL 39(L) 42 - - 39(L)  Calc LDL 0 - 99 mg/dL 116(H) 120(H) - - 126(H)  Triglycerides <150 mg/dL 151(H) 138 - - 203(H)  Creatinine 0.50 - 1.35 mg/dL 1.01 1.26 1.16 1.31 1.13   BP/Weight 03/02/2015 08/29/2014 06/14/2014 04/08/2014 10/06/2013 08/03/960 11/30/6627  Systolic BP 476 546 503 546 568 127 517  Diastolic BP 80 84 84 96 82 80 93  Wt. (Lbs) 254 266 268 237 262 257 257  BMI 37.49 39.26 39.56 34.98 38.67 37.93 37.93   Foot/eye exam completion dates 06/14/2014 03/16/2013  Foot Form Completion Done Done  Essential hypertension Controlled, no change in medication DASH diet and commitment to daily physical activity for a minimum of 30 minutes discussed and encouraged, as a part of hypertension management. The importance of attaining a healthy weight is also discussed.  BP/Weight 03/02/2015 08/29/2014 06/14/2014 04/08/2014 10/06/2013 01/05/2640 09/03/3092  Systolic BP 076 808 811 031 594 585 929  Diastolic BP 80 84 84 96 82 80 93  Wt. (Lbs) 254 266 268 237 262 257 257  BMI 37.49 39.26 39.56 34.98 38.67 37.93 37.93        Morbid obesity Deteriorated. Patient re-educated about  the  importance of commitment to a  minimum of 150 minutes of exercise per week.  The importance of healthy food choices with portion control discussed. Encouraged to start a food diary, count calories and to consider  joining a support group. Sample diet sheets offered. Goals set by the patient for the next several months.   Weight /BMI 03/02/2015 08/29/2014 06/14/2014  WEIGHT 254 lb 266 lb 268 lb  HEIGHT 5\' 9"  5\' 9"  5\' 9"   BMI 37.49 kg/m2 39.26 kg/m2 39.56 kg/m2    Current exercise per week 90 minutes.   Hyperlipidemia with target LDL less than 100 Hyperlipidemia:Low fat diet discussed and encouraged.   Lipid Panel  Lab Results  Component Value Date   CHOL 185 06/14/2014   HDL 39* 06/14/2014   LDLCALC 116* 06/14/2014   TRIG 151* 06/14/2014   CHOLHDL 4.7 06/14/2014   Updated lab scanned in and reveals improvement     ED (erectile dysfunction) Untreated due to  Med cost and an ongoing problem. cript and coupon provided, pt to call back if too expensive       Review of Systems     Objective:   Physical Exam        Assessment & Plan:

## 2015-03-12 NOTE — Assessment & Plan Note (Signed)
Hyperlipidemia:Low fat diet discussed and encouraged.   Lipid Panel  Lab Results  Component Value Date   CHOL 185 06/14/2014   HDL 39* 06/14/2014   LDLCALC 116* 06/14/2014   TRIG 151* 06/14/2014   CHOLHDL 4.7 06/14/2014   Updated lab scanned in and reveals improvement

## 2015-03-12 NOTE — Assessment & Plan Note (Signed)
Deteriorated. Patient re-educated about  the importance of commitment to a  minimum of 150 minutes of exercise per week.  The importance of healthy food choices with portion control discussed. Encouraged to start a food diary, count calories and to consider  joining a support group. Sample diet sheets offered. Goals set by the patient for the next several months.   Weight /BMI 03/02/2015 08/29/2014 06/14/2014  WEIGHT 254 lb 266 lb 268 lb  HEIGHT 5\' 9"  5\' 9"  5\' 9"   BMI 37.49 kg/m2 39.26 kg/m2 39.56 kg/m2    Current exercise per week 90 minutes.

## 2015-03-12 NOTE — Assessment & Plan Note (Signed)
Controlled, no change in medication DASH diet and commitment to daily physical activity for a minimum of 30 minutes discussed and encouraged, as a part of hypertension management. The importance of attaining a healthy weight is also discussed.  BP/Weight 03/02/2015 08/29/2014 06/14/2014 04/08/2014 10/06/2013 [id number]  Systolic BP 123456 123456 123456 0000000 123456 [id number]  Diastolic BP 80 84 84 96 82 80 93  Wt. (Lbs) 254 266 268 237 262 257 257  BMI 37.49 39.26 39.56 34.98 38.67 37.93 37.93

## 2015-03-12 NOTE — Assessment & Plan Note (Signed)
Controlled, no change in medication Roger Prince is reminded of the importance of commitment to daily physical activity for 30 minutes or more, as able and the need to limit carbohydrate intake to 30 to 60 grams per meal to help with blood sugar control.   The need to take medication as prescribed, test blood sugar as directed, and to call between visits if there is a concern that blood sugar is uncontrolled is also discussed.   Roger Prince is reminded of the importance of daily foot exam, annual eye examination, and good blood sugar, blood pressure and cholesterol control.  Diabetic Labs Latest Ref Rng 06/14/2014 10/09/2013 05/02/2013 04/29/2013 03/16/2013  HbA1c <5.7 % 6.9(H) 6.8(H) - - 6.7(H)  Microalbumin <2.0 mg/dL 0.3 - - - 0.50  Micro/Creat Ratio 0.0 - 30.0 mg/g 1.9 - - - 3.4  Chol 0 - 200 mg/dL 185 190 - - 206(H)  HDL >39 mg/dL 39(L) 42 - - 39(L)  Calc LDL 0 - 99 mg/dL 116(H) 120(H) - - 126(H)  Triglycerides <150 mg/dL 151(H) 138 - - 203(H)  Creatinine 0.50 - 1.35 mg/dL 1.01 1.26 1.16 1.31 1.13   BP/Weight 03/02/2015 08/29/2014 06/14/2014 04/08/2014 10/06/2013 [id number]  Systolic BP 123456 123456 123456 0000000 123456 [id number]  Diastolic BP 80 84 84 96 82 80 93  Wt. (Lbs) 254 266 268 237 262 257 257  BMI 37.49 39.26 39.56 34.98 38.67 37.93 37.93   Foot/eye exam completion dates 06/14/2014 03/16/2013  Foot Form Completion Done Done

## 2015-03-12 NOTE — Assessment & Plan Note (Signed)
Untreated due to  Med cost and an ongoing problem. cript and coupon provided, pt to call back if too expensive

## 2015-03-13 ENCOUNTER — Other Ambulatory Visit: Payer: Self-pay

## 2015-03-13 ENCOUNTER — Telehealth: Payer: Self-pay | Admitting: Family Medicine

## 2015-03-13 DIAGNOSIS — I10 Essential (primary) hypertension: Secondary | ICD-10-CM

## 2015-03-13 MED ORDER — TRIAMTERENE-HCTZ 37.5-25 MG PO TABS: 1.0000 | ORAL_TABLET | Freq: Every day | ORAL | Status: DC

## 2015-03-13 MED ORDER — BENAZEPRIL HCL 20 MG PO TABS: 20.0000 mg | ORAL_TABLET | Freq: Every day | ORAL | Status: DC

## 2015-03-13 NOTE — Telephone Encounter (Signed)
Patient is requesting a refill on medications benazepril (LOTENSIN) 20 MG tablet and triamterene-hydrochlorothiazide (MAXZIDE-25) 37.5-25 MG per tablet please advise?

## 2015-03-13 NOTE — Telephone Encounter (Signed)
Medications refilled

## 2015-03-28 ENCOUNTER — Other Ambulatory Visit: Payer: Self-pay | Admitting: Family Medicine

## 2015-05-26 ENCOUNTER — Other Ambulatory Visit: Payer: Self-pay | Admitting: Family Medicine

## 2015-07-31 ENCOUNTER — Ambulatory Visit: Payer: BLUE CROSS/BLUE SHIELD | Admitting: Family Medicine

## 2015-08-18 ENCOUNTER — Other Ambulatory Visit: Payer: Self-pay | Admitting: Family Medicine

## 2015-08-21 DIAGNOSIS — I10 Essential (primary) hypertension: Secondary | ICD-10-CM | POA: Diagnosis not present

## 2015-08-21 DIAGNOSIS — Z125 Encounter for screening for malignant neoplasm of prostate: Secondary | ICD-10-CM | POA: Diagnosis not present

## 2015-08-21 DIAGNOSIS — E119 Type 2 diabetes mellitus without complications: Secondary | ICD-10-CM | POA: Diagnosis not present

## 2015-08-21 DIAGNOSIS — E785 Hyperlipidemia, unspecified: Secondary | ICD-10-CM | POA: Diagnosis not present

## 2015-08-22 LAB — COMPLETE METABOLIC PANEL WITH GFR
ALT: 20 U/L (ref 9–46)
AST: 22 U/L (ref 10–35)
Albumin: 4.2 g/dL (ref 3.6–5.1)
Alkaline Phosphatase: 60 U/L (ref 40–115)
BUN: 13 mg/dL (ref 7–25)
CO2: 23 mmol/L (ref 20–31)
Calcium: 9.7 mg/dL (ref 8.6–10.3)
Chloride: 101 mmol/L (ref 98–110)
Creat: 1.02 mg/dL (ref 0.70–1.33)
GFR, Est African American: >89 mL/min (ref >=60)
GFR, Est Non African American: 84 mL/min (ref >=60)
Glucose, Bld: 110 mg/dL — ABNORMAL HIGH (ref 65–99)
Potassium: 4.5 mmol/L (ref 3.5–5.3)
Sodium: 139 mmol/L (ref 135–146)
Total Bilirubin: 0.6 mg/dL (ref 0.2–1.2)
Total Protein: 7 g/dL (ref 6.1–8.1)

## 2015-08-22 LAB — CBC WITH DIFFERENTIAL/PLATELET
Basophils Absolute: 0 cells/uL (ref 0–200)
Basophils Relative: 0 %
Eosinophils Absolute: 138 cells/uL (ref 15–500)
Eosinophils Relative: 2 %
HCT: 45.6 % (ref 38.5–50.0)
Hemoglobin: 15 g/dL (ref 13.2–17.1)
Lymphocytes Relative: 53 %
Lymphs Abs: 3657 cells/uL (ref 850–3900)
MCH: 27.3 pg (ref 27.0–33.0)
MCHC: 32.9 g/dL (ref 32.0–36.0)
MCV: 82.9 fL (ref 80.0–100.0)
MPV: 9.6 fL (ref 7.5–12.5)
Monocytes Absolute: 414 cells/uL (ref 200–950)
Monocytes Relative: 6 %
Neutro Abs: 2691 cells/uL (ref 1500–7800)
Neutrophils Relative %: 39 %
Platelets: 149 10*3/uL (ref 140–400)
RBC: 5.5 MIL/uL (ref 4.20–5.80)
RDW: 14.3 % (ref 11.0–15.0)
WBC: 6.9 10*3/uL (ref 3.8–10.8)

## 2015-08-22 LAB — HEMOGLOBIN A1C
Hgb A1c MFr Bld: 6.8 % — ABNORMAL HIGH (ref <5.7)
Mean Plasma Glucose: 148 mg/dL

## 2015-08-22 LAB — TSH: TSH: 0.14 mIU/L — ABNORMAL LOW (ref 0.40–4.50)

## 2015-08-22 LAB — MICROALBUMIN / CREATININE URINE RATIO
Creatinine, Urine: 115 mg/dL (ref 20–370)
Microalb Creat Ratio: 2 mcg/mg creat (ref <30)
Microalb, Ur: 0.2 mg/dL

## 2015-08-22 LAB — LIPID PANEL
Cholesterol: 143 mg/dL (ref 125–200)
HDL: 43 mg/dL (ref >=40)
LDL Cholesterol: 75 mg/dL (ref <130)
Total CHOL/HDL Ratio: 3.3 Ratio (ref <=5.0)
Triglycerides: 126 mg/dL (ref <150)
VLDL: 25 mg/dL (ref <30)

## 2015-08-22 LAB — PSA: PSA: 0.63 ng/mL (ref <=4.00)

## 2015-08-31 ENCOUNTER — Ambulatory Visit (INDEPENDENT_AMBULATORY_CARE_PROVIDER_SITE_OTHER): Payer: BLUE CROSS/BLUE SHIELD | Admitting: Family Medicine

## 2015-08-31 ENCOUNTER — Encounter: Payer: Self-pay | Admitting: Family Medicine

## 2015-08-31 VITALS — BP 124/82 | HR 95 | Resp 16 | Ht 69.0 in | Wt 260.0 lb

## 2015-08-31 DIAGNOSIS — I10 Essential (primary) hypertension: Secondary | ICD-10-CM | POA: Diagnosis not present

## 2015-08-31 DIAGNOSIS — N521 Erectile dysfunction due to diseases classified elsewhere: Secondary | ICD-10-CM

## 2015-08-31 DIAGNOSIS — E119 Type 2 diabetes mellitus without complications: Secondary | ICD-10-CM

## 2015-08-31 DIAGNOSIS — E785 Hyperlipidemia, unspecified: Secondary | ICD-10-CM | POA: Diagnosis not present

## 2015-08-31 NOTE — Assessment & Plan Note (Signed)
Controlled, no change in medication Roger Prince is reminded of the importance of commitment to daily physical activity for 30 minutes or more, as able and the need to limit carbohydrate intake to 30 to 60 grams per meal to help with blood sugar control.   The need to take medication as prescribed, test blood sugar as directed, and to call between visits if there is a concern that blood sugar is uncontrolled is also discussed.   Roger Prince is reminded of the importance of daily foot exam, annual eye examination, and good blood sugar, blood pressure and cholesterol control.  Diabetic Labs Latest Ref Rng 08/21/2015 02/27/2015 06/14/2014 10/09/2013 05/02/2013  HbA1c <5.7 % 6.8(H) 6.6(H) 6.9(H) 6.8(H) -  Microalbumin Not estab mg/dL 0.2 - 0.3 - -  Micro/Creat Ratio <30 mcg/mg creat 2 - 1.9 - -  Chol 125 - 200 mg/dL 143 164 185 190 -  HDL >=40 mg/dL 43 38(L) 39(L) 42 -  Calc LDL <130 mg/dL 75 98 116(H) 120(H) -  Triglycerides <150 mg/dL 126 140 151(H) 138 -  Creatinine 0.70 - 1.33 mg/dL 1.02 1.08 1.01 1.26 1.16   BP/Weight 08/31/2015 03/02/2015 08/29/2014 06/14/2014 04/08/2014 [id number]  Systolic BP A999333 123456 123456 123456 0000000 123456 [id number]  Diastolic BP 82 80 84 84 96 82 80  Wt. (Lbs) 260 254 266 268 237 262 257  BMI 38.38 37.49 39.26 39.56 34.98 38.67 37.93   Foot/eye exam completion dates 06/14/2014 03/16/2013  Foot Form Completion Done Done

## 2015-08-31 NOTE — Assessment & Plan Note (Signed)
Controlled, no change in medication DASH diet and commitment to daily physical activity for a minimum of 30 minutes discussed and encouraged, as a part of hypertension management. The importance of attaining a healthy weight is also discussed.  BP/Weight 08/31/2015 03/02/2015 08/29/2014 06/14/2014 04/08/2014 [id number]  Systolic BP A999333 123456 123456 123456 0000000 123456 [id number]  Diastolic BP 82 80 84 84 96 82 80  Wt. (Lbs) 260 254 266 268 237 262 257  BMI 38.38 37.49 39.26 39.56 34.98 38.67 37.93

## 2015-08-31 NOTE — Assessment & Plan Note (Signed)
Hyperlipidemia:Low fat diet discussed and encouraged.   Lipid Panel  Lab Results  Component Value Date   CHOL 143 08/21/2015   HDL 43 08/21/2015   LDLCALC 75 08/21/2015   TRIG 126 08/21/2015   CHOLHDL 3.3 08/21/2015   Controlled, no change in medication

## 2015-08-31 NOTE — Patient Instructions (Signed)
Annual physical exam in 5 months, call if you need me before  Excellent labs and exam  NEED to change eating to much more fruit and vegetable, less sweets and meats to lose weight and improve health  It is important that you exercise regularly at least 30 minutes 7 times a week. If you develop chest pain, have severe difficulty breathing, or feel very tired, stop exercising immediately and seek medical attention   Non fast labs in 5 months  Thank you  for choosing [facility]. We consider it a privelige to serve you.  Delivering excellent health care in a caring and  compassionate way is our goal.  Partnering with you,  so that together we can achieve this goal is our strategy.

## 2015-08-31 NOTE — Assessment & Plan Note (Signed)
Deteriorated. Patient re-educated about  the importance of commitment to a  minimum of 150 minutes of exercise per week.  The importance of healthy food choices with portion control discussed. Encouraged to start a food diary, count calories and to consider  joining a support group. Sample diet sheets offered. Goals set by the patient for the next several months.   Weight /BMI 08/31/2015 03/02/2015 08/29/2014  WEIGHT 260 lb 254 lb 266 lb  HEIGHT 5\' 9"  5\' 9"  5\' 9"   BMI 38.38 kg/m2 37.49 kg/m2 39.26 kg/m2    Current exercise per week 90 minutes.

## 2015-08-31 NOTE — Assessment & Plan Note (Signed)
Controlled, no change in medication  

## 2015-08-31 NOTE — Progress Notes (Signed)
Subjective:    Patient ID: Juanda Bond, male    DOB: 18-Sep-1960, 55 y.o.   MRN: [id number]  [place]     MRN: [id number]      DOB: March 02, 1961   HPI Mr. Schelle is here for follow up and re-evaluation of chronic medical conditions, medication management and review of any available recent lab and radiology data.  Preventive health is updated, specifically  Cancer screening and Immunization.   Questions or concerns regarding consultations or procedures which the PT has had in the interim are  addressed. The PT denies any adverse reactions to current medications since the last visit.  There are no new concerns.  There are no specific complaints  C/o weight gain, and insufficient exercise , plans    ROS Denies recent fever or chills. Denies sinus pressure, nasal congestion, ear pain or sore throat. Denies chest congestion, productive cough or wheezing. Denies chest pains, palpitations and leg swelling Denies abdominal pain, nausea, vomiting,diarrhea or constipation.   Denies dysuria, frequency, hesitancy or incontinence. Denies joint pain, swelling and limitation in mobility. Denies headaches, seizures, numbness, or tingling. Denies depression, anxiety or insomnia. Denies skin break down or rash.   PE  BP 124/82 mmHg  Pulse 95  Resp 16  Ht 5\' 9"  (1.753 m)  Wt 260 lb (117.935 kg)  BMI 38.38 kg/m2  SpO2 96%  Patient alert and oriented and in no cardiopulmonary distress.  HEENT: No facial asymmetry, EOMI,   oropharynx pink and moist.  Neck supple no JVD, no mass.  Chest: Clear to auscultation bilaterally.  CVS: S1, S2 no murmurs, no S3.Regular rate.  ABD: Soft non tender.   Ext: No edema  MS: Adequate ROM spine, shoulders, hips and knees.  Skin: Intact, no ulcerations or rash noted.  Psych: Good eye contact, normal affect. Memory intact not anxious or depressed appearing.  CNS: CN 2-12 intact, power,  normal throughout.no focal deficits noted.   Assessment &  Plan   Essential hypertension Controlled, no change in medication DASH diet and commitment to daily physical activity for a minimum of 30 minutes discussed and encouraged, as a part of hypertension management. The importance of attaining a healthy weight is also discussed.  BP/Weight 08/31/2015 03/02/2015 08/29/2014 06/14/2014 04/08/2014 [id number]  Systolic BP A999333 123456 123456 [id number] [id number]  Diastolic BP 82 80 84 84 96 82 80  Wt. (Lbs) 260 254 266 268 237 262 257  BMI 38.38 37.49 39.26 39.56 34.98 38.67 37.93        DM type 2, goal A1c below 7 Controlled, no change in medication Mr. Zilch is reminded of the importance of commitment to daily physical activity for 30 minutes or more, as able and the need to limit carbohydrate intake to 30 to 60 grams per meal to help with blood sugar control.   The need to take medication as prescribed, test blood sugar as directed, and to call between visits if there is a concern that blood sugar is uncontrolled is also discussed.   Mr. Alvizo is reminded of the importance of daily foot exam, annual eye examination, and good blood sugar, blood pressure and cholesterol control.  Diabetic Labs Latest Ref Rng 08/21/2015 02/27/2015 06/14/2014 10/09/2013 05/02/2013  HbA1c <5.7 % 6.8(H) 6.6(H) 6.9(H) 6.8(H) -  Microalbumin Not estab mg/dL 0.2 - 0.3 - -  Micro/Creat Ratio <30 mcg/mg creat 2 - 1.9 - -  Chol 125 - 200 mg/dL 143 164 185 190 -  HDL >=40 mg/dL 43 38(L) 39(L) 42 -  Calc LDL <130 mg/dL 75 98 116(H) 120(H) -  Triglycerides <150 mg/dL 126 140 151(H) 138 -  Creatinine 0.70 - 1.33 mg/dL 1.02 1.08 1.01 1.26 1.16   BP/Weight 08/31/2015 03/02/2015 08/29/2014 06/14/2014 04/08/2014 [id number]  Systolic BP A999333 123456 123456 123456 0000000 [id number]  Diastolic BP 82 80 84 84 96 82 80  Wt. (Lbs) 260 254 266 268 237 262 257  BMI 38.38 37.49 39.26 39.56 34.98 38.67 37.93   Foot/eye exam completion dates 06/14/2014 03/16/2013  Foot Form Completion Done Done          Hyperlipidemia with target LDL less than 100 Hyperlipidemia:Low fat diet discussed and encouraged.   Lipid Panel  Lab Results  Component Value Date   CHOL 143 08/21/2015   HDL 43 08/21/2015   LDLCALC 75 08/21/2015   TRIG 126 08/21/2015   CHOLHDL 3.3 08/21/2015   Controlled, no change in medication      ED (erectile dysfunction) Controlled, no change in medication   Morbid obesity Deteriorated. Patient re-educated about  the importance of commitment to a  minimum of 150 minutes of exercise per week.  The importance of healthy food choices with portion control discussed. Encouraged to start a food diary, count calories and to consider  joining a support group. Sample diet sheets offered. Goals set by the patient for the next several months.   Weight /BMI 08/31/2015 03/02/2015 08/29/2014  WEIGHT 260 lb 254 lb 266 lb  HEIGHT 5\' 9"  5\' 9"  5\' 9"   BMI 38.38 kg/m2 37.49 kg/m2 39.26 kg/m2    Current exercise per week 90 minutes.       Review of Systems     Objective:   Physical Exam        Assessment & Plan:

## 2015-10-03 ENCOUNTER — Other Ambulatory Visit: Payer: Self-pay | Admitting: Family Medicine

## 2015-10-22 ENCOUNTER — Other Ambulatory Visit: Payer: Self-pay | Admitting: Family Medicine

## 2015-10-26 ENCOUNTER — Telehealth: Payer: Self-pay | Admitting: Family Medicine

## 2015-10-26 ENCOUNTER — Other Ambulatory Visit: Payer: Self-pay

## 2015-10-26 MED ORDER — METFORMIN HCL ER 500 MG PO TB24: 500.0000 mg | ORAL_TABLET | Freq: Every day | ORAL | Status: DC

## 2015-10-26 NOTE — Telephone Encounter (Signed)
Patient is needing a refill on metFORMIN (GLUCOPHAGE-XR) 500 MG 24 hr tablet TODAY!!! He took last pill today, please advise?

## 2015-10-26 NOTE — Telephone Encounter (Signed)
Medication refilled

## 2016-01-03 ENCOUNTER — Emergency Department (HOSPITAL_COMMUNITY): Payer: BLUE CROSS/BLUE SHIELD

## 2016-01-03 ENCOUNTER — Emergency Department (HOSPITAL_COMMUNITY)
Admission: EM | Admit: 2016-01-03 | Discharge: 2016-01-03 | Disposition: A | Discharge status: Home / Self Care | Payer: BLUE CROSS/BLUE SHIELD | Attending: Emergency Medicine | Admitting: Emergency Medicine

## 2016-01-03 ENCOUNTER — Encounter (HOSPITAL_COMMUNITY): Payer: Self-pay | Admitting: *Deleted

## 2016-01-03 DIAGNOSIS — Z79899 Other long term (current) drug therapy: Secondary | ICD-10-CM | POA: Insufficient documentation

## 2016-01-03 DIAGNOSIS — M5440 Lumbago with sciatica, unspecified side: Secondary | ICD-10-CM

## 2016-01-03 DIAGNOSIS — Z87891 Personal history of nicotine dependence: Secondary | ICD-10-CM | POA: Diagnosis not present

## 2016-01-03 DIAGNOSIS — R0789 Other chest pain: Secondary | ICD-10-CM | POA: Insufficient documentation

## 2016-01-03 DIAGNOSIS — M545 Low back pain: Secondary | ICD-10-CM | POA: Diagnosis not present

## 2016-01-03 DIAGNOSIS — E119 Type 2 diabetes mellitus without complications: Secondary | ICD-10-CM | POA: Insufficient documentation

## 2016-01-03 DIAGNOSIS — M5442 Lumbago with sciatica, left side: Secondary | ICD-10-CM | POA: Diagnosis not present

## 2016-01-03 DIAGNOSIS — I10 Essential (primary) hypertension: Secondary | ICD-10-CM | POA: Insufficient documentation

## 2016-01-03 DIAGNOSIS — Z7984 Long term (current) use of oral hypoglycemic drugs: Secondary | ICD-10-CM | POA: Insufficient documentation

## 2016-01-03 DIAGNOSIS — Z7982 Long term (current) use of aspirin: Secondary | ICD-10-CM | POA: Diagnosis not present

## 2016-01-03 DIAGNOSIS — M5441 Lumbago with sciatica, right side: Secondary | ICD-10-CM | POA: Diagnosis not present

## 2016-01-03 DIAGNOSIS — M791 Myalgia: Secondary | ICD-10-CM | POA: Insufficient documentation

## 2016-01-03 DIAGNOSIS — M549 Dorsalgia, unspecified: Secondary | ICD-10-CM | POA: Diagnosis not present

## 2016-01-03 LAB — TROPONIN I: Troponin I: <0.03 ng/mL (ref <0.03)

## 2016-01-03 LAB — CBC
HCT: 46.5 % (ref 39.0–52.0)
Hemoglobin: 15.6 g/dL (ref 13.0–17.0)
MCH: 28 pg (ref 26.0–34.0)
MCHC: 33.5 g/dL (ref 30.0–36.0)
MCV: 83.5 fL (ref 78.0–100.0)
Platelets: 164 10*3/uL (ref 150–400)
RBC: 5.57 MIL/uL (ref 4.22–5.81)
RDW: 13.3 % (ref 11.5–15.5)
WBC: 8.7 10*3/uL (ref 4.0–10.5)

## 2016-01-03 LAB — BASIC METABOLIC PANEL
Anion gap: 8 (ref 5–15)
BUN: 19 mg/dL (ref 6–20)
CO2: 27 mmol/L (ref 22–32)
Calcium: 9.8 mg/dL (ref 8.9–10.3)
Chloride: 100 mmol/L — ABNORMAL LOW (ref 101–111)
Creatinine, Ser: 1.33 mg/dL — ABNORMAL HIGH (ref 0.61–1.24)
GFR calc Af Amer: >60 mL/min (ref >60)
GFR calc non Af Amer: 59 mL/min — ABNORMAL LOW (ref >60)
Glucose, Bld: 109 mg/dL — ABNORMAL HIGH (ref 65–99)
Potassium: 3.9 mmol/L (ref 3.5–5.1)
Sodium: 135 mmol/L (ref 135–145)

## 2016-01-03 LAB — HEPATIC FUNCTION PANEL
ALT: 19 U/L (ref 17–63)
AST: 24 U/L (ref 15–41)
Albumin: 4.6 g/dL (ref 3.5–5.0)
Alkaline Phosphatase: 55 U/L (ref 38–126)
Bilirubin, Direct: <0.1 mg/dL — ABNORMAL LOW (ref 0.1–0.5)
Total Bilirubin: 0.7 mg/dL (ref 0.3–1.2)
Total Protein: 8.1 g/dL (ref 6.5–8.1)

## 2016-01-03 LAB — LIPASE, BLOOD: Lipase: 66 U/L — ABNORMAL HIGH (ref 11–51)

## 2016-01-03 LAB — BRAIN NATRIURETIC PEPTIDE: B Natriuretic Peptide: 64 pg/mL (ref 0.0–100.0)

## 2016-01-03 MED ORDER — ONDANSETRON HCL 4 MG/2ML IJ SOLN
4.0000 mg | Freq: Once | INTRAMUSCULAR | Status: AC
  Administered 2016-01-03: 4 mg via INTRAVENOUS
  Filled 2016-01-03: qty 2

## 2016-01-03 MED ORDER — OXYCODONE-ACETAMINOPHEN 5-325 MG PO TABS
1.0000 | ORAL_TABLET | Freq: Four times a day (QID) | ORAL | 0 refills | Status: DC | PRN
Start: 2016-01-03 — End: 2016-01-05

## 2016-01-03 MED ORDER — HYDROMORPHONE HCL 1 MG/ML IJ SOLN
1.0000 mg | Freq: Once | INTRAMUSCULAR | Status: AC
  Administered 2016-01-03: 1 mg via INTRAVENOUS
  Filled 2016-01-03: qty 1

## 2016-01-03 MED ORDER — NAPROXEN 500 MG PO TABS: 500.0000 mg | ORAL_TABLET | Freq: Two times a day (BID) | ORAL | 0 refills | Status: DC

## 2016-01-03 NOTE — ED Notes (Signed)
Pt denies any CP at this time. States pain is in LT flank and radiates down into leg. Pt states he does have PMH of kidney stones.

## 2016-01-03 NOTE — ED Notes (Signed)
EDP at bedside updating patient and family. 

## 2016-01-03 NOTE — ED Notes (Signed)
EDP at bedside  

## 2016-01-03 NOTE — ED Triage Notes (Signed)
Pt comes in for back pain that started last week after he bent over to pick up a baby. Pt has had shooting pain in his left leg since then with intermittent numbness. Pt is ambulatory.   While triaging pt, he states that since this morning he has had chest pain described as a tight pressure.

## 2016-01-03 NOTE — Discharge Instructions (Signed)
Follow-up with your family doctor in 2 days for recheck of lipase and sciatica and your chest discomfort.

## 2016-01-03 NOTE — ED Provider Notes (Signed)
[facility] DEPT Provider Note   CSN: [id number] Arrival date & time: 01/03/16  1009  By signing my name below, I, Rayna Sexton, attest that this documentation has been prepared under the direction and in the presence of Milton Ferguson, MD. Electronically Signed: Rayna Sexton, ED Scribe. 01/03/16. 11:52 AM.   History   Chief Complaint Chief Complaint  Patient presents with  . Chest Pain  . Back Pain    HPI HPI Comments: Roger Prince is a 55 y.o. male with a h/o DM, obesity and HTN who presents to the Emergency Department complaining of sudden onset, moderate, left lower back pain x 1 week. Pt states he has been experiencing his back pain since bending over to pick up a child and has been taking Aleve for pain management which provides short term relief. His pain radiates down his LLE and has experienced intermittent numbness in the leg. He woke up this morning and began to experience an episode of chest tightness which lasted about 1 hour. Pt states he has been told he needs to have a stress test due to "having an irregular heartbeat". Pt reports a FMHx of cardiac issues noting his father had CHF. He denies other associated symptoms at this time.   Patient complains of left lower back pain. He also states that he had some chest pressure recently but none now   The history is provided by the patient. No language interpreter was used.  Back Pain   This is a new problem. The current episode started more than 2 days ago. The problem occurs constantly. The problem has not changed since onset.The pain is associated with lifting heavy objects. The pain is present in the lumbar spine. The pain radiates to the left thigh. The pain is at a severity of 5/10. The pain is moderate. The symptoms are aggravated by certain positions. The pain is the same all the time. Associated symptoms include numbness. Pertinent negatives include no chest pain, no headaches and no abdominal pain. He has tried bed  rest (Aleve) for the symptoms. The treatment provided mild relief. Risk factors include obesity.   Past Medical History:  Diagnosis Date  . Diabetes mellitus without complication ([place])   . Hypertension     Patient Active Problem List   Diagnosis Date Noted  . Seasonal allergies 06/14/2014  . Tubular adenoma of colon 04/11/2014  . DM type 2, goal A1c below 7 01/08/2012  . Hyperlipidemia with target LDL less than 100 01/08/2012  . ED (erectile dysfunction) 05/22/2011  . Morbid obesity ([place]) 11/06/2008  . Essential hypertension 11/01/2008  . Sleep apnea 11/01/2008    Past Surgical History:  Procedure Laterality Date  . COLONOSCOPY N/A 04/08/2014   Procedure: COLONOSCOPY;  Surgeon: Danie Binder, MD;  Location: [facility] SUITE;  Service: Endoscopy;  Laterality: N/A;  10:30 AM       Home Medications    Prior to Admission medications   Medication Sig Start Date End Date Taking? Authorizing Provider  aspirin EC 81 MG tablet Take 1 tablet (81 mg total) by mouth daily. 10/06/13  Yes Fayrene Helper, MD  benazepril (LOTENSIN) 20 MG tablet TAKE ONE TABLET BY MOUTH ONCE DAILY 10/03/15  Yes Fayrene Helper, MD  lovastatin (MEVACOR) 20 MG tablet TAKE ONE TABLET BY MOUTH AT BEDTIME 08/21/15  Yes Fayrene Helper, MD  metFORMIN (GLUCOPHAGE-XR) 500 MG 24 hr tablet Take 1 tablet (500 mg total) by mouth daily. 10/26/15  Yes Fayrene Helper, MD  sildenafil (VIAGRA) 100 MG tablet Take 0.5-1 tablets (50-100 mg total) by mouth daily as needed for erectile dysfunction. 03/02/15  Yes Fayrene Helper, MD  triamterene-hydrochlorothiazide [facility]) 37.5-25 MG tablet TAKE ONE TABLET BY MOUTH ONCE DAILY 10/03/15  Yes Fayrene Helper, MD    Family History Family History  Problem Relation Age of Onset  . Congestive Heart Failure Father     Social History Social History  Substance Use Topics  . Smoking status: Former Smoker    Years: 2.00    Types: Cigars  . Smokeless tobacco: Never  Used  . Alcohol use No     Allergies   Meperidine hcl   Review of Systems Review of Systems  Constitutional: Negative for appetite change and fatigue.  HENT: Negative for congestion, ear discharge and sinus pressure.   Eyes: Negative for discharge.  Respiratory: Positive for chest tightness. Negative for cough.   Cardiovascular: Negative for chest pain.  Gastrointestinal: Negative for abdominal pain and diarrhea.  Genitourinary: Negative for frequency and hematuria.  Musculoskeletal: Positive for back pain and myalgias.  Skin: Negative for rash.  Neurological: Positive for numbness. Negative for seizures and headaches.  Psychiatric/Behavioral: Negative for hallucinations.   Physical Exam Updated Vital Signs BP 108/84 (BP Location: Left Arm)   Pulse 93   Temp 98.7 F (37.1 C) (Oral)   Resp 16   Ht 5\' 9"  (1.753 m)   Wt 237 lb (107.5 kg)   SpO2 95%   BMI 35.00 kg/m   Physical Exam  Constitutional: He is oriented to person, place, and time. He appears well-developed.  HENT:  Head: Normocephalic.  Eyes: Conjunctivae and EOM are normal. No scleral icterus.  Neck: Neck supple. No thyromegaly present.  Cardiovascular: Normal rate and regular rhythm.  Exam reveals no gallop and no friction rub.   No murmur heard. Pulmonary/Chest: No stridor. He has no wheezes. He has no rales. He exhibits no tenderness.  Abdominal: He exhibits no distension. There is no tenderness. There is no rebound.  Musculoskeletal: Normal range of motion. He exhibits no edema.  Lymphadenopathy:    He has no cervical adenopathy.  Neurological: He is oriented to person, place, and time. He exhibits normal muscle tone. Coordination normal.  Skin: No rash noted. No erythema.  Psychiatric: He has a normal mood and affect. His behavior is normal.   ED Treatments / Results  Labs (all labs ordered are listed, but only abnormal results are displayed) Labs Reviewed  BASIC METABOLIC PANEL - Abnormal; Notable  for the following:       Result Value   Chloride 100 (*)    Glucose, Bld 109 (*)    Creatinine, Ser 1.33 (*)    GFR calc non Af Amer 59 (*)    All other components within normal limits  CBC  TROPONIN I    EKG  EKG Interpretation None       Radiology Dg Chest 2 View  Result Date: 01/03/2016 CLINICAL DATA:  Back pain started after he bent over to pick up the baby. EXAM: CHEST  2 VIEW COMPARISON:  05/06/2013 FINDINGS: The heart size and mediastinal contours are within normal limits. Both lungs are clear. The visualized skeletal structures are unremarkable. IMPRESSION: No active cardiopulmonary disease. Electronically Signed   By: Kathreen Devoid   On: 01/03/2016 10:43    Procedures Procedures  DIAGNOSTIC STUDIES: Oxygen Saturation is 95% on RA, adequate by my interpretation.    COORDINATION OF CARE: 11:50 AM Discussed next steps  with pt. Pt verbalized understanding and is agreeable with the plan.    Medications Ordered in ED Medications - No data to display   Initial Impression / Assessment and Plan / ED Course  I have reviewed the triage vital signs and the nursing notes.  Pertinent labs & imaging results that were available during my care of the patient were reviewed by me and considered in my medical decision making (see chart for details).  Clinical Course    Patient with back pain and sciatica on the left. He will be treated with Naprosyn and Vicodin. Patient also had some chest pain EKG shows nothing acute troponin normal. He will follow-up with his PCP and possibly get a stress test. Elevated lipase. Patient lipase is mildly elevated liver studies are normal. He will follow-up with PCP to repeat lipase soon.   The chart was scribed for me under my direct supervision.  I personally performed the history, physical, and medical decision making and all procedures in the evaluation of this patient..  Final Clinical Impressions(s) / ED Diagnoses   Final diagnoses:  None     New Prescriptions New Prescriptions   No medications on file     Milton Ferguson, MD 01/03/16 [phone]

## 2016-01-05 ENCOUNTER — Encounter: Payer: Self-pay | Admitting: Family Medicine

## 2016-01-05 ENCOUNTER — Ambulatory Visit (INDEPENDENT_AMBULATORY_CARE_PROVIDER_SITE_OTHER): Payer: BLUE CROSS/BLUE SHIELD | Admitting: Family Medicine

## 2016-01-05 VITALS — BP 98/64 | HR 74 | Resp 18 | Ht 69.0 in | Wt 249.0 lb

## 2016-01-05 DIAGNOSIS — M5137 Other intervertebral disc degeneration, lumbosacral region: Secondary | ICD-10-CM | POA: Diagnosis not present

## 2016-01-05 DIAGNOSIS — M47816 Spondylosis without myelopathy or radiculopathy, lumbar region: Secondary | ICD-10-CM | POA: Insufficient documentation

## 2016-01-05 DIAGNOSIS — M51379 Other intervertebral disc degeneration, lumbosacral region without mention of lumbar back pain or lower extremity pain: Secondary | ICD-10-CM

## 2016-01-05 DIAGNOSIS — M5416 Radiculopathy, lumbar region: Secondary | ICD-10-CM

## 2016-01-05 MED ORDER — METHYLPREDNISOLONE 4 MG PO TBPK: ORAL_TABLET | ORAL | 0 refills | Status: DC

## 2016-01-05 MED ORDER — TIZANIDINE HCL 4 MG PO CAPS: 4.0000 mg | ORAL_CAPSULE | Freq: Three times a day (TID) | ORAL | 0 refills | Status: DC | PRN

## 2016-01-05 MED ORDER — OXYCODONE-ACETAMINOPHEN 10-325 MG PO TABS: 1.0000 | ORAL_TABLET | ORAL | 0 refills | Status: DC | PRN

## 2016-01-05 NOTE — Patient Instructions (Addendum)
Take the medrol pak This is the prednisone anti inflammatory to take down nerve irritation This might temporarily raise your sugar Stop naproxen while on the medrol  Tizanidine is to take as needed muscle relaxer.  Take mostly at night  Oxycodone is to take as needed severe pain.  CAUTION CONSTIPATION  I have referred to PT  Off work until seen in follow up  Activity as tolerated.  Walk and do activities that do not increase your pain, rest when needed.  Degenerative Disk Disease Degenerative disk disease is a condition caused by the changes that occur in spinal disks as you grow older. Spinal disks are soft and compressible disks located between the bones of your spine (vertebrae). These disks act like shock absorbers. Degenerative disk disease can affect the whole spine. However, the neck and lower back are most commonly affected. Many changes can occur in the spinal disks with aging, such as:  The spinal disks may dry and shrink.  Small tears may occur in the tough, outer covering of the disk (annulus).  The disk space may become smaller due to loss of water.  Abnormal growths in the bone (spurs) may occur. This can put pressure on the nerve roots exiting the spinal canal, causing pain.  The spinal canal may become narrowed. RISK FACTORS   Being overweight.  Having a family history of degenerative disk disease.  Smoking.  There is increased risk if you are doing heavy lifting or have a sudden injury. SIGNS AND SYMPTOMS  Symptoms vary from person to person and may include:  Pain that varies in intensity. Some people have no pain, while others have severe pain. The location of the pain depends on the part of your backbone that is affected.  You will have neck or arm pain if a disk in the neck area is affected.  You will have pain in your back, buttocks, or legs if a disk in the lower back is affected.  Pain that becomes worse while bending, reaching up, or with twisting  movements.  Pain that may start gradually and then get worse as time passes. It may also start after a major or minor injury.  Numbness or tingling in the arms or legs. DIAGNOSIS  Your health care provider will ask you about your symptoms and about activities or habits that may cause the pain. He or she may also ask about any injuries, diseases, or treatments you have had. Your health care provider will examine you to check for the range of movement that is possible in the affected area, to check for strength in your extremities, and to check for sensation in the areas of the arms and legs supplied by different nerve roots. You may also have:   An X-ray of the spine.  Other imaging tests, such as MRI. TREATMENT  Your health care provider will advise you on the best plan for treatment. Treatment may include:  Medicines.  Rehabilitation exercises. HOME CARE INSTRUCTIONS   Follow proper lifting and walking techniques as advised by your health care provider.  Maintain good posture.  Exercise regularly as advised by your health care provider.  Perform relaxation exercises.  Change your sitting, standing, and sleeping habits as advised by your health care provider.  Change positions frequently.  Lose weight or maintain a healthy weight as advised by your health care provider.  Do not use any tobacco products, including cigarettes, chewing tobacco, or electronic cigarettes. If you need help quitting, ask your health care  provider.  Wear supportive footwear.  Take medicines only as directed by your health care provider. SEEK MEDICAL CARE IF:   Your pain does not go away within 1-4 weeks.  You have significant appetite or weight loss. SEEK IMMEDIATE MEDICAL CARE IF:   Your pain is severe.  You notice weakness in your arms, hands, or legs.  You begin to lose control of your bladder or bowel movements.  You have fevers or night sweats. MAKE SURE YOU:   Understand these  instructions.  Will watch your condition.  Will get help right away if you are not doing well or get worse.   This information is not intended to replace advice given to you by your health care provider. Make sure you discuss any questions you have with your health care provider.   Document Released: 02/10/2007 Document Revised: 05/06/2014 Document Reviewed: 08/17/2013 Elsevier Interactive Patient Education [employer].

## 2016-01-05 NOTE — Progress Notes (Addendum)
Chief Complaint  Patient presents with  . 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 He is a patient of Dr. Tula Nakayama. He is here for follow up after emergency room visit. He states approximately week ago he bent over to pick up a small child. He developed pain in his back. The pain is severe at times. It radiates down the back of his left leg to the foot. He has numbness in his heel and lateral foot. He was managing this at home with Aleve. When he didn't get better he went to the emergency room. X-rays were performed. They documented lumbar degenerative disc disease at the L4-L5 level with bone spurs and disc space narrowing. He was treated with Naprosyn 500 twice a day and oxycodone 5 mg when necessary pain. These have not given him any relief. He is still very uncomfortable. He can lie comfortably, and stand for short periods of time. He cannot sit comfortably. At times his left leg feels weak. He has no bowel or bladder incontinence, no perineal numbness. No fever or recent infection. No prior history of cancer. Patient states he's had occasional back pain for years. Usually gets a soreness in the central low back. He has never had severe pain into his leg or disc problems in the past. He has not been able to work this week because of the pain. The patient is diabetic. His diabetes is usually well controlled. He is also on medication for hypertension and cholesterol. He doesn't have a history of arthritis. No family history of arthritis. No history of back injury or trauma. He did play high school sports.   Patient Active Problem List   Diagnosis Date Noted  . Degenerative joint disease (DJD) of lumbar spine 01/05/2016  . Seasonal allergies 06/14/2014  . Tubular adenoma of colon 04/11/2014  . DM type 2, goal A1c below 7 01/08/2012  . Hyperlipidemia with target LDL less than 100 01/08/2012  . ED (erectile dysfunction) 05/22/2011  . Morbid obesity  ([place]) 11/06/2008  . Essential hypertension 11/01/2008  . Sleep apnea 11/01/2008    Outpatient Encounter Prescriptions as of 01/05/2016  Medication Sig  . aspirin EC 81 MG tablet Take 1 tablet (81 mg total) by mouth daily.  . benazepril (LOTENSIN) 20 MG tablet TAKE ONE TABLET BY MOUTH ONCE DAILY  . lovastatin (MEVACOR) 20 MG tablet TAKE ONE TABLET BY MOUTH AT BEDTIME  . metFORMIN (GLUCOPHAGE-XR) 500 MG 24 hr tablet Take 1 tablet (500 mg total) by mouth daily.  . naproxen (NAPROSYN) 500 MG tablet Take 1 tablet (500 mg total) by mouth 2 (two) times daily.  . sildenafil (VIAGRA) 100 MG tablet Take 0.5-1 tablets (50-100 mg total) by mouth daily as needed for erectile dysfunction.  . triamterene-hydrochlorothiazide (MAXZIDE-25) 37.5-25 MG tablet TAKE ONE TABLET BY MOUTH ONCE DAILY  . methylPREDNISolone (MEDROL DOSEPAK) 4 MG TBPK tablet TAD  . oxyCODONE-acetaminophen (PERCOCET) 10-325 MG tablet Take 1 tablet by mouth every 4 (four) hours as needed for pain.  Marland Kitchen tiZANidine (ZANAFLEX) 4 MG capsule Take 1 capsule (4 mg total) by mouth 3 (three) times daily as needed for muscle spasms.   No facility-administered encounter medications on file as of 01/05/2016.     Allergies  Allergen Reactions  . Meperidine Hcl Other (See Comments)    Burning    Review of Systems  Constitutional: Positive for activity change. Negative for appetite change and fever.  Eyes: Negative.  Respiratory: Negative.   Cardiovascular: Negative.   Gastrointestinal: Negative for blood in stool, constipation and diarrhea.  Endocrine: Negative for polydipsia and polyuria.  Genitourinary: Negative for difficulty urinating, flank pain and frequency.  Musculoskeletal: Positive for back pain and gait problem. Negative for neck pain and neck stiffness.  Skin: Negative for rash.  Neurological: Positive for weakness and numbness. Negative for light-headedness.  Psychiatric/Behavioral: Positive for sleep disturbance. Negative for  dysphoric mood. The patient is not nervous/anxious.   See history of present illness  BP 98/64   Pulse 74   Resp 18   Ht 5\' 9"  (1.753 m)   Wt 249 lb (112.9 kg)   SpO2 96%   BMI 36.77 kg/m   Physical Exam  Constitutional: He is oriented to person, place, and time. He appears well-developed and well-nourished. He appears distressed.  Uncomfortable, Paces in exam room  HENT:  Head: Normocephalic and atraumatic.  Mouth/Throat: Oropharynx is clear and moist.  Eyes: Conjunctivae are normal. Pupils are equal, round, and reactive to light.  Neck: Normal range of motion. Neck supple.  Cardiovascular: Normal rate, regular rhythm and normal heart sounds.   Pulmonary/Chest: Effort normal and breath sounds normal. No respiratory distress.  Musculoskeletal: Normal range of motion. He exhibits no edema.  Lumbar spine is straight and symmetric. Moderately limited range of motion. No tenderness or muscle spasm. Strength, sensation, range of motion, and reflexes are normal in right lower extremities. Straight leg raise is negative right. Left leg has normal strength and muscular function. Reflexes normal at the knee but decreased at the ankle. Decreased sensation in the posterior calf and lateral foot. Right leg raise is positive on the left.   Lymphadenopathy:    He has no cervical adenopathy.  Neurological: He is alert and oriented to person, place, and time. He displays abnormal reflex.  Skin: Skin is warm and dry. No rash noted.  Psychiatric: He has a normal mood and affect. His behavior is normal. Thought content normal.  Emergency records are reviewed and discussed with patient's. Laboratory tests are normal. Minimally elevated lipase is not medically significant. X-rays are pulled up and reviewed with patient and his wife. Abnormalities described patient.  ASSESSMENT/PLAN:  1. Degeneration of lumbar or lumbosacral intervertebral disc  Discussed conservative management of low back pain and  radicular pain. He needs a trial of anti-inflammatories rest physical therapy and modified activity. If he fails conservative therapy he may need an MRI and specialty referral. We discussed that the prednisone medication can increase his blood sugar and that he should monitor this. He will stop the Naprosyn while on the anti-inflammatory. Discussed that most nerve inflammation, disc pain will resolve conservatively. Discussed the importance of regular stretching exercises, maintaining an ideal deal body weight, and being careful from here on with bending and lifting activities.  Discussed the addictive properties of oxycodone. It's beneficial for short-term use but not long-term. Discussed the side effects of dizziness drowsiness and constipation. He is told not to drive or operate machinery while on this medication. He is discussed this medication will not be refilled.  2. Lumbar radiculopathy, acute  - Ambulatory referral to Physical Therapy   Patient Instructions  Take the medrol pak This is the prednisone anti inflammatory to take down nerve irritation This might temporarily raise your sugar Stop naproxen while on the medrol  Tizanidine is to take as needed muscle relaxer.  Take mostly at night  Oxycodone is to take as needed severe pain.  CAUTION CONSTIPATION  I have referred to PT  Off work until seen in follow up  Activity as tolerated.  Walk and do activities that do not increase your pain, rest when needed.  Degenerative Disk Disease Degenerative disk disease is a condition caused by the changes that occur in spinal disks as you grow older. Spinal disks are soft and compressible disks located between the bones of your spine (vertebrae). These disks act like shock absorbers. Degenerative disk disease can affect the whole spine. However, the neck and lower back are most commonly affected. Many changes can occur in the spinal disks with aging, such as:  The spinal disks may dry and  shrink.  Small tears may occur in the tough, outer covering of the disk (annulus).  The disk space may become smaller due to loss of water.  Abnormal growths in the bone (spurs) may occur. This can put pressure on the nerve roots exiting the spinal canal, causing pain.  The spinal canal may become narrowed. RISK FACTORS   Being overweight.  Having a family history of degenerative disk disease.  Smoking.  There is increased risk if you are doing heavy lifting or have a sudden injury. SIGNS AND SYMPTOMS  Symptoms vary from person to person and may include:  Pain that varies in intensity. Some people have no pain, while others have severe pain. The location of the pain depends on the part of your backbone that is affected.  You will have neck or arm pain if a disk in the neck area is affected.  You will have pain in your back, buttocks, or legs if a disk in the lower back is affected.  Pain that becomes worse while bending, reaching up, or with twisting movements.  Pain that may start gradually and then get worse as time passes. It may also start after a major or minor injury.  Numbness or tingling in the arms or legs. DIAGNOSIS  Your health care provider will ask you about your symptoms and about activities or habits that may cause the pain. He or she may also ask about any injuries, diseases, or treatments you have had. Your health care provider will examine you to check for the range of movement that is possible in the affected area, to check for strength in your extremities, and to check for sensation in the areas of the arms and legs supplied by different nerve roots. You may also have:   An X-ray of the spine.  Other imaging tests, such as MRI. TREATMENT  Your health care provider will advise you on the best plan for treatment. Treatment may include:  Medicines.  Rehabilitation exercises. HOME CARE INSTRUCTIONS   Follow proper lifting and walking techniques as advised  by your health care provider.  Maintain good posture.  Exercise regularly as advised by your health care provider.  Perform relaxation exercises.  Change your sitting, standing, and sleeping habits as advised by your health care provider.  Change positions frequently.  Lose weight or maintain a healthy weight as advised by your health care provider.  Do not use any tobacco products, including cigarettes, chewing tobacco, or electronic cigarettes. If you need help quitting, ask your health care provider.  Wear supportive footwear.  Take medicines only as directed by your health care provider. SEEK MEDICAL CARE IF:   Your pain does not go away within 1-4 weeks.  You have significant appetite or weight loss. SEEK IMMEDIATE MEDICAL CARE IF:   Your pain is severe.  You notice  weakness in your arms, hands, or legs.  You begin to lose control of your bladder or bowel movements.  You have fevers or night sweats. MAKE SURE YOU:   Understand these instructions.  Will watch your condition.  Will get help right away if you are not doing well or get worse.   This information is not intended to replace advice given to you by your health care provider. Make sure you discuss any questions you have with your health care provider.   Document Released: 02/10/2007 Document Revised: 05/06/2014 Document Reviewed: 08/17/2013 Elsevier Interactive Patient Education 2016 [employer].    Raylene Everts, MD

## 2016-01-09 ENCOUNTER — Encounter: Payer: Self-pay | Admitting: Family Medicine

## 2016-01-17 ENCOUNTER — Encounter: Payer: Self-pay | Admitting: Family Medicine

## 2016-01-17 ENCOUNTER — Ambulatory Visit (INDEPENDENT_AMBULATORY_CARE_PROVIDER_SITE_OTHER): Payer: BLUE CROSS/BLUE SHIELD | Admitting: Family Medicine

## 2016-01-17 VITALS — BP 122/80 | HR 100 | Resp 18 | Ht 69.0 in | Wt 248.0 lb

## 2016-01-17 DIAGNOSIS — M5416 Radiculopathy, lumbar region: Secondary | ICD-10-CM

## 2016-01-17 DIAGNOSIS — Z23 Encounter for immunization: Secondary | ICD-10-CM

## 2016-01-17 MED ORDER — TIZANIDINE HCL 4 MG PO CAPS: 4.0000 mg | ORAL_CAPSULE | Freq: Three times a day (TID) | ORAL | 0 refills | Status: DC | PRN

## 2016-01-17 NOTE — Patient Instructions (Signed)
Continue to be careful with activity Walk every day that you are able  Return to work Monday  Call for any problems

## 2016-01-17 NOTE — Progress Notes (Signed)
Chief Complaint  Patient presents with  . Back Pain    follow up for release to return to work- still has some leg weakness    Mr. Kalicki is here for follow-up of his lumbar spine problem. He had low back strain with radiculopathy that suspicious for herniated disc. He has done well with conservative treatment. Took medications prescribed last visit. He did not have any problems with his diabetes. No side effects from medications. He found the pain medicine un pleasant, so he is only been taking the Zanaflex as needed and over-the-counter pain products. He has been modifying his activity. He can walk without limp. He is resting more comfortably. His back pain is almost gone. The leg pain previously went on his left leg to his foot and now only goes to the posterior thigh. No bowel or bladder problems. No new injury. He has been off work and is eager to return his work in a couple of days.  Patient Active Problem List   Diagnosis Date Noted  . Degenerative joint disease (DJD) of lumbar spine 01/05/2016  . Seasonal allergies 06/14/2014  . Tubular adenoma of colon 04/11/2014  . DM type 2, goal A1c below 7 01/08/2012  . Hyperlipidemia with target LDL less than 100 01/08/2012  . ED (erectile dysfunction) 05/22/2011  . Morbid obesity ([place]) 11/06/2008  . Essential hypertension 11/01/2008  . Sleep apnea 11/01/2008    Outpatient Encounter Prescriptions as of 01/17/2016  Medication Sig  . aspirin EC 81 MG tablet Take 1 tablet (81 mg total) by mouth daily.  . benazepril (LOTENSIN) 20 MG tablet TAKE ONE TABLET BY MOUTH ONCE DAILY  . lovastatin (MEVACOR) 20 MG tablet TAKE ONE TABLET BY MOUTH AT BEDTIME  . metFORMIN (GLUCOPHAGE-XR) 500 MG 24 hr tablet Take 1 tablet (500 mg total) by mouth daily.  . naproxen (NAPROSYN) 500 MG tablet Take 1 tablet (500 mg total) by mouth 2 (two) times daily.  Marland Kitchen oxyCODONE-acetaminophen (PERCOCET) 10-325 MG tablet Take 1 tablet by mouth every 4 (four) hours as needed for  pain.  . sildenafil (VIAGRA) 100 MG tablet Take 0.5-1 tablets (50-100 mg total) by mouth daily as needed for erectile dysfunction.  Marland Kitchen tiZANidine (ZANAFLEX) 4 MG capsule Take 1 capsule (4 mg total) by mouth 3 (three) times daily as needed for muscle spasms.  Marland Kitchen triamterene-hydrochlorothiazide (MAXZIDE-25) 37.5-25 MG tablet TAKE ONE TABLET BY MOUTH ONCE DAILY  . [DISCONTINUED] tiZANidine (ZANAFLEX) 4 MG capsule Take 1 capsule (4 mg total) by mouth 3 (three) times daily as needed for muscle spasms.  . [DISCONTINUED] methylPREDNISolone (MEDROL DOSEPAK) 4 MG TBPK tablet TAD   No facility-administered encounter medications on file as of 01/17/2016.     Allergies  Allergen Reactions  . Meperidine Hcl Other (See Comments)    Burning   Review of Systems  Constitutional: Positive for activity change. Negative for appetite change and fever.  Eyes: Negative.   Respiratory: Negative.   Cardiovascular: Negative.   Gastrointestinal: Negative for blood in stool, constipation and diarrhea.  Endocrine: Negative for polydipsia and polyuria.  Genitourinary: Negative for difficulty urinating, flank pain and frequency.  Musculoskeletal: Negative for back pain, gait problem, neck pain and neck stiffness.  Skin: Negative for rash.  Neurological: Positive for weakness and numbness. Negative for light-headedness.  Psychiatric/Behavioral: Negative for dysphoric mood and sleep disturbance. The patient is not nervous/anxious.       BP 122/80   Pulse 100   Resp 18   Ht 5\' 9"  (  1.753 m)   Wt 248 lb (112.5 kg)   SpO2 96%   BMI 36.62 kg/m   Physical Exam  Constitutional: He is oriented to person, place, and time. He appears well-developed and well-nourished. No distress.  Smiling, relaxed  HENT:  Head: Normocephalic and atraumatic.  Mouth/Throat: Oropharynx is clear and moist.  Cardiovascular: Normal rate, regular rhythm and normal heart sounds.   Pulmonary/Chest: Effort normal and breath sounds normal.  No respiratory distress.  Musculoskeletal: Normal range of motion. He exhibits no edema.  Lumbar spine is straight and symmetric. Moderately limited range of motion. No tenderness or muscle spasm. Strength, sensation, range of motion, and reflexes are normal in right lower extremities. Straight leg raise is negative right. Left leg has normal strength and muscular function. Reflexes normal at the knee but decreased at the ankle. No sensory deficits. No strength deficits.   Neurological: He is alert and oriented to person, place, and time. He displays abnormal reflex.  Skin: Skin is warm and dry. No rash noted.  Psychiatric: He has a normal mood and affect. His behavior is normal. Thought content normal.    ASSESSMENT/PLAN:  1. Lumbar radicular pain Patient still has left ankle reflex. Straight leg raise is gone. He still has mild radiculopathy in the posterior thigh. I discussed with him that he was improved but not completely well. He still needs to modify his activity. He should stretch and walk every day he is able. He is allowed to go back to work but he should not do any heavy lifting for at least additional 2 weeks. Zanaflex is refilled for when necessary use. He will call or return if his symptoms worsen.  2. Encounter for immunization  - Flu Vaccine QUAD 36+ mos IM   Patient Instructions  Continue to be careful with activity Walk every day that you are able  Return to work Monday  Call for any problems   Raylene Everts, MD

## 2016-01-17 NOTE — Progress Notes (Signed)
ROS  Physical Exam     

## 2016-01-22 ENCOUNTER — Other Ambulatory Visit: Payer: Self-pay | Admitting: Family Medicine

## 2016-01-29 ENCOUNTER — Other Ambulatory Visit: Payer: Self-pay | Admitting: Family Medicine

## 2016-01-31 ENCOUNTER — Encounter: Payer: BLUE CROSS/BLUE SHIELD | Admitting: Family Medicine

## 2016-02-01 ENCOUNTER — Other Ambulatory Visit: Payer: Self-pay | Admitting: Family Medicine

## 2016-02-19 ENCOUNTER — Encounter: Payer: BLUE CROSS/BLUE SHIELD | Admitting: Family Medicine

## 2016-05-22 ENCOUNTER — Other Ambulatory Visit: Payer: Self-pay | Admitting: Family Medicine

## 2016-05-31 ENCOUNTER — Other Ambulatory Visit: Payer: Self-pay

## 2016-05-31 ENCOUNTER — Telehealth: Payer: Self-pay | Admitting: Family Medicine

## 2016-05-31 MED ORDER — METFORMIN HCL ER 500 MG PO TB24: 500.0000 mg | ORAL_TABLET | Freq: Every day | ORAL | 0 refills | Status: DC

## 2016-05-31 MED ORDER — TRIAMTERENE-HCTZ 37.5-25 MG PO TABS: 1.0000 | ORAL_TABLET | Freq: Every day | ORAL | 0 refills | Status: DC

## 2016-05-31 MED ORDER — BENAZEPRIL HCL 20 MG PO TABS: 20.0000 mg | ORAL_TABLET | Freq: Every day | ORAL | 0 refills | Status: DC

## 2016-05-31 NOTE — Telephone Encounter (Signed)
Roger Prince is stating that he doesn't have any refills on his Blood Pressure Medications and Metformin, please advise?

## 2016-05-31 NOTE — Telephone Encounter (Signed)
meds called in for 1 x only

## 2016-07-03 ENCOUNTER — Telehealth: Payer: Self-pay

## 2016-07-03 DIAGNOSIS — E119 Type 2 diabetes mellitus without complications: Secondary | ICD-10-CM

## 2016-07-03 DIAGNOSIS — I10 Essential (primary) hypertension: Secondary | ICD-10-CM

## 2016-07-03 LAB — BASIC METABOLIC PANEL
BUN: 11 mg/dL (ref 7–25)
CO2: 28 mmol/L (ref 20–31)
Calcium: 10.2 mg/dL (ref 8.6–10.3)
Chloride: 100 mmol/L (ref 98–110)
Creat: 1.12 mg/dL (ref 0.70–1.33)
Glucose, Bld: 117 mg/dL — ABNORMAL HIGH (ref 65–99)
Potassium: 5 mmol/L (ref 3.5–5.3)
Sodium: 140 mmol/L (ref 135–146)

## 2016-07-03 LAB — HEMOGLOBIN A1C
Hgb A1c MFr Bld: 6.4 % — ABNORMAL HIGH (ref <5.7)
Mean Plasma Glucose: 137 mg/dL

## 2016-07-03 LAB — TSH: TSH: 1.51 mIU/L (ref 0.40–4.50)

## 2016-07-03 NOTE — Telephone Encounter (Signed)
Labs ordered.

## 2016-07-08 ENCOUNTER — Encounter: Payer: BLUE CROSS/BLUE SHIELD | Admitting: Family Medicine

## 2016-07-24 ENCOUNTER — Other Ambulatory Visit: Payer: Self-pay | Admitting: Family Medicine

## 2016-07-31 ENCOUNTER — Encounter: Payer: Self-pay | Admitting: Family Medicine

## 2016-07-31 ENCOUNTER — Ambulatory Visit (INDEPENDENT_AMBULATORY_CARE_PROVIDER_SITE_OTHER): Payer: BLUE CROSS/BLUE SHIELD | Admitting: Family Medicine

## 2016-07-31 VITALS — BP 118/80 | HR 100 | Temp 97.4°F | Resp 18 | Ht 69.0 in | Wt 258.0 lb

## 2016-07-31 DIAGNOSIS — I10 Essential (primary) hypertension: Secondary | ICD-10-CM | POA: Diagnosis not present

## 2016-07-31 DIAGNOSIS — Z1211 Encounter for screening for malignant neoplasm of colon: Secondary | ICD-10-CM

## 2016-07-31 DIAGNOSIS — E785 Hyperlipidemia, unspecified: Secondary | ICD-10-CM | POA: Diagnosis not present

## 2016-07-31 DIAGNOSIS — Z125 Encounter for screening for malignant neoplasm of prostate: Secondary | ICD-10-CM | POA: Diagnosis not present

## 2016-07-31 DIAGNOSIS — Z0000 Encounter for general adult medical examination without abnormal findings: Secondary | ICD-10-CM | POA: Diagnosis not present

## 2016-07-31 DIAGNOSIS — E119 Type 2 diabetes mellitus without complications: Secondary | ICD-10-CM

## 2016-07-31 LAB — POC HEMOCCULT BLD/STL (OFFICE/1-CARD/DIAGNOSTIC): Fecal Occult Blood, POC: NEGATIVE

## 2016-07-31 MED ORDER — TADALAFIL 20 MG PO TABS: 20.0000 mg | ORAL_TABLET | Freq: Every day | ORAL | 5 refills | Status: DC | PRN

## 2016-07-31 NOTE — Patient Instructions (Addendum)
f/u 2nd week in October, call if you need me sooner  Fasting lipid, cmp and eGFR, hBAiC , TSH and cBC  1 week before follow up  Excellent lab and exam today  Weight loss goal of 8 pounds  Please work on good  health habits so that your health will improve. 1. Commitment to daily physical activity for 30 to 60  minutes, if you are able to do this.  2. Commitment to wise food choices. Aim for half of your  food intake to be vegetable and fruit, one quarter starchy foods, and one quarter protein. Try to eat on a regular schedule  3 meals per day, snacking between meals should be limited to vegetables or fruits or small portions of nuts. 64 ounces of water per day is generally recommended, unless you have specific health conditions, like heart failure or kidney failure where you will need to limit fluid intake.  3. Commitment to sufficient and a  good quality of physical and mental rest daily, generally between 6 to 8 hours per day.  WITH PERSISTANCE AND PERSEVERANCE, THE IMPOSSIBLE , BECOMES THE NORM! Thank you  for choosing [facility]. We consider it a privelige to serve you.  Delivering excellent health care in a caring and  compassionate way is our goal.  Partnering with you,  so that together we can achieve this goal is our strategy.   New medication cialis sent as discussed

## 2016-08-04 DIAGNOSIS — Z0000 Encounter for general adult medical examination without abnormal findings: Secondary | ICD-10-CM | POA: Insufficient documentation

## 2016-08-04 HISTORY — DX: Encounter for general adult medical examination without abnormal findings: Z00.00

## 2016-08-04 NOTE — Progress Notes (Signed)
Roger Prince     MRN: [id number]      DOB: 1961-04-22   HPI: Patient is in for annual physical exam. No other health concerns are expressed or addressed at the visit. Recent labs, if available are reviewed. Immunization is reviewed , and  updated if needed.    PE; BP 118/80 (BP Location: Right Arm, Patient Position: Sitting, Cuff Size: Large)   Pulse 100   Temp 97.4 F (36.3 C) (Temporal)   Resp 18   Ht 5\' 9"  (1.753 m)   Wt 258 lb 0.6 oz (117 kg)   SpO2 96%   BMI 38.11 kg/m   Pleasant male, alert and oriented x 3, in no cardio-pulmonary distress. Afebrile. HEENT No facial trauma or asymetry. Sinuses non tender. EOMI, pupils equally reactive to light. External ears normal, tympanic membranes clear. Oropharynx moist, no exudate. Neck: supple, no adenopathy,JVD or thyromegaly.No bruits.  Chest: Clear to ascultation bilaterally.No crackles or wheezes. Non tender to palpation  Breast: No asymetry,no masses. No nipple discharge or inversion. No axillary or supraclavicular adenopathy  Cardiovascular system; Heart sounds normal,  S1 and  S2 ,no S3.  No murmur, or thrill. Apical beat not displaced Peripheral pulses normal.  Abdomen: Soft, non tender, no organomegaly or masses. No bruits. Bowel sounds normal. No guarding, tenderness or rebound.  Rectal:  Normal sphincter tone. No hemorrhoids or  masses. guaiac negative stool. Prostate smooth and firm    Musculoskeletal exam: Full ROM of spine, hips , shoulders and knees. No deformity ,swelling or crepitus noted. No muscle wasting or atrophy.   Neurologic: Cranial nerves 2 to 12 intact. Power, tone ,sensation and reflexes normal throughout. No disturbance in gait. No tremor.  Skin: Intact, no ulceration, erythema , scaling or rash noted. Pigmentation normal throughout  Psych; Normal mood and affect. Judgement and concentration normal   Assessment & Plan:  Annual physical exam Annual exam as  documented. Counseling done  re healthy lifestyle involving commitment to 150 minutes exercise per week, heart healthy diet, and attaining healthy weight.The importance of adequate sleep also discussed. Regular seat belt use and home safety, is also discussed. Changes in health habits are decided on by the patient with goals and time frames  set for achieving them. Immunization and cancer screening needs are specifically addressed at this visit.   Type 2 diabetes mellitus with hemoglobin A1c goal of less than 7.0% [facility]) Roger Prince is reminded of the importance of commitment to daily physical activity for 30 minutes or more, as able and the need to limit carbohydrate intake to 30 to 60 grams per meal to help with blood sugar control.   The need to take medication as prescribed, test blood sugar as directed, and to call between visits if there is a concern that blood sugar is uncontrolled is also discussed.   Roger Prince is reminded of the importance of daily foot exam, annual eye examination, and good blood sugar, blood pressure and cholesterol control. Controlled, no change in medication   Diabetic Labs Latest Ref Rng & Units 07/03/2016 01/03/2016 08/21/2015 02/27/2015 06/14/2014  HbA1c <5.7 % 6.4(H) - 6.8(H) 6.6(H) 6.9(H)  Microalbumin Not estab mg/dL - - 0.2 - 0.3  Micro/Creat Ratio <30 mcg/mg creat - - 2 - 1.9  Chol 125 - 200 mg/dL - - 143 164 185  HDL >=40 mg/dL - - 43 38(L) 39(L)  Calc LDL <130 mg/dL - - 75 98 116(H)  Triglycerides <150 mg/dL - - 126 140 151(H)  Creatinine 0.70 - 1.33 mg/dL 1.12 1.33(H) 1.02 1.08 1.01   BP/Weight 07/31/2016 01/17/2016 01/05/2016 01/03/2016 08/31/2015 59/0/9311 05/30/6242  Systolic BP 695 072 98 257 505 183 358  Diastolic BP 80 80 64 78 82 80 84  Wt. (Lbs) 258.04 248 249 237 260 254 266  BMI 38.11 36.62 36.77 35 38.38 37.49 39.26   Foot/eye exam completion dates 07/31/2016 08/31/2015  Foot Form Completion Done Done

## 2016-08-04 NOTE — Assessment & Plan Note (Signed)
Roger Prince is reminded of the importance of commitment to daily physical activity for 30 minutes or more, as able and the need to limit carbohydrate intake to 30 to 60 grams per meal to help with blood sugar control.   The need to take medication as prescribed, test blood sugar as directed, and to call between visits if there is a concern that blood sugar is uncontrolled is also discussed.   Roger Prince is reminded of the importance of daily foot exam, annual eye examination, and good blood sugar, blood pressure and cholesterol control. Controlled, no change in medication   Diabetic Labs Latest Ref Rng & Units 07/03/2016 01/03/2016 08/21/2015 02/27/2015 06/14/2014  HbA1c <5.7 % 6.4(H) - 6.8(H) 6.6(H) 6.9(H)  Microalbumin Not estab mg/dL - - 0.2 - 0.3  Micro/Creat Ratio <30 mcg/mg creat - - 2 - 1.9  Chol 125 - 200 mg/dL - - 143 164 185  HDL >=40 mg/dL - - 43 38(L) 39(L)  Calc LDL <130 mg/dL - - 75 98 116(H)  Triglycerides <150 mg/dL - - 126 140 151(H)  Creatinine 0.70 - 1.33 mg/dL 1.12 1.33(H) 1.02 1.08 1.01   BP/Weight 07/31/2016 01/17/2016 01/05/2016 01/03/2016 08/31/2015 40/12/8117 05/03/7827  Systolic BP 562 130 98 865 784 696 295  Diastolic BP 80 80 64 78 82 80 84  Wt. (Lbs) 258.04 248 249 237 260 254 266  BMI 38.11 36.62 36.77 35 38.38 37.49 39.26   Foot/eye exam completion dates 07/31/2016 08/31/2015  Foot Form Completion Done Done

## 2016-08-04 NOTE — Assessment & Plan Note (Signed)

## 2016-09-06 ENCOUNTER — Other Ambulatory Visit: Payer: Self-pay

## 2016-09-06 ENCOUNTER — Other Ambulatory Visit: Payer: Self-pay | Admitting: Family Medicine

## 2016-09-06 MED ORDER — TRIAMTERENE-HCTZ 37.5-25 MG PO TABS: 1.0000 | ORAL_TABLET | Freq: Every day | ORAL | 0 refills | Status: DC

## 2016-09-09 ENCOUNTER — Other Ambulatory Visit: Payer: Self-pay

## 2016-09-09 MED ORDER — BENAZEPRIL HCL 20 MG PO TABS: 20.0000 mg | ORAL_TABLET | Freq: Every day | ORAL | 1 refills | Status: DC

## 2016-09-09 MED ORDER — METFORMIN HCL ER 500 MG PO TB24: 500.0000 mg | ORAL_TABLET | Freq: Every day | ORAL | 1 refills | Status: DC

## 2016-09-09 MED ORDER — TRIAMTERENE-HCTZ 37.5-25 MG PO TABS: 1.0000 | ORAL_TABLET | Freq: Every day | ORAL | 1 refills | Status: DC

## 2017-01-09 DIAGNOSIS — Z23 Encounter for immunization: Secondary | ICD-10-CM | POA: Diagnosis not present

## 2017-02-09 ENCOUNTER — Other Ambulatory Visit: Payer: Self-pay | Admitting: Family Medicine

## 2017-02-10 NOTE — Telephone Encounter (Signed)
Seen 4 4 18

## 2017-02-12 ENCOUNTER — Ambulatory Visit: Payer: BLUE CROSS/BLUE SHIELD | Admitting: Family Medicine

## 2017-03-03 ENCOUNTER — Ambulatory Visit: Payer: BLUE CROSS/BLUE SHIELD | Admitting: Family Medicine

## 2017-03-11 ENCOUNTER — Other Ambulatory Visit: Payer: Self-pay

## 2017-03-11 ENCOUNTER — Telehealth: Payer: Self-pay | Admitting: Family Medicine

## 2017-03-11 MED ORDER — BENAZEPRIL HCL 20 MG PO TABS: 20.0000 mg | ORAL_TABLET | Freq: Every day | ORAL | 1 refills | Status: DC

## 2017-03-11 NOTE — Telephone Encounter (Signed)
Med sent.

## 2017-03-11 NOTE — Telephone Encounter (Signed)
Patient r/s his appt this morning for 03/31/17, he is requesting his blood pressure medication to be refilled.

## 2017-03-24 ENCOUNTER — Ambulatory Visit: Payer: BLUE CROSS/BLUE SHIELD | Admitting: Family Medicine

## 2017-03-27 ENCOUNTER — Telehealth: Payer: Self-pay | Admitting: Family Medicine

## 2017-03-27 DIAGNOSIS — E119 Type 2 diabetes mellitus without complications: Secondary | ICD-10-CM

## 2017-03-27 DIAGNOSIS — Z125 Encounter for screening for malignant neoplasm of prostate: Secondary | ICD-10-CM

## 2017-03-27 DIAGNOSIS — E785 Hyperlipidemia, unspecified: Secondary | ICD-10-CM

## 2017-03-27 DIAGNOSIS — I10 Essential (primary) hypertension: Secondary | ICD-10-CM

## 2017-03-27 NOTE — Telephone Encounter (Signed)
Lab orders were expired. Updated.

## 2017-03-28 LAB — COMPLETE METABOLIC PANEL WITH GFR
AG Ratio: 1.4 (calc) (ref 1.0–2.5)
ALT: 19 U/L (ref 9–46)
AST: 20 U/L (ref 10–35)
Albumin: 4.4 g/dL (ref 3.6–5.1)
Alkaline phosphatase (APISO): 70 U/L (ref 40–115)
BUN: 17 mg/dL (ref 7–25)
CO2: 26 mmol/L (ref 20–32)
Calcium: 10 mg/dL (ref 8.6–10.3)
Chloride: 100 mmol/L (ref 98–110)
Creat: 1.1 mg/dL (ref 0.70–1.33)
GFR, Est African American: 87 mL/min/{1.73_m2} (ref >=60)
GFR, Est Non African American: 75 mL/min/{1.73_m2} (ref >=60)
Globulin: 3.2 g/dL (calc) (ref 1.9–3.7)
Glucose, Bld: 128 mg/dL — ABNORMAL HIGH (ref 65–99)
Potassium: 4.3 mmol/L (ref 3.5–5.3)
Sodium: 137 mmol/L (ref 135–146)
Total Bilirubin: 0.8 mg/dL (ref 0.2–1.2)
Total Protein: 7.6 g/dL (ref 6.1–8.1)

## 2017-03-28 LAB — LIPID PANEL
Cholesterol: 200 mg/dL — ABNORMAL HIGH (ref <200)
HDL: 43 mg/dL (ref >40)
LDL Cholesterol (Calc): 135 mg/dL (calc) — ABNORMAL HIGH
Non-HDL Cholesterol (Calc): 157 mg/dL (calc) — ABNORMAL HIGH (ref <130)
Total CHOL/HDL Ratio: 4.7 (calc) (ref <5.0)
Triglycerides: 114 mg/dL (ref <150)

## 2017-03-28 LAB — HEMOGLOBIN A1C
Hgb A1c MFr Bld: 6.6 % of total Hgb — ABNORMAL HIGH (ref <5.7)
Mean Plasma Glucose: 143 (calc)
eAG (mmol/L): 7.9 (calc)

## 2017-03-28 LAB — PSA: PSA: 0.5 ng/mL (ref <=4.0)

## 2017-03-29 ENCOUNTER — Other Ambulatory Visit: Payer: Self-pay | Admitting: Family Medicine

## 2017-03-31 ENCOUNTER — Other Ambulatory Visit: Payer: Self-pay

## 2017-03-31 ENCOUNTER — Encounter: Payer: Self-pay | Admitting: Family Medicine

## 2017-03-31 ENCOUNTER — Ambulatory Visit: Payer: BLUE CROSS/BLUE SHIELD | Admitting: Family Medicine

## 2017-03-31 VITALS — BP 118/80 | HR 92 | Temp 98.8°F | Resp 18 | Ht 69.0 in | Wt 255.0 lb

## 2017-03-31 DIAGNOSIS — E785 Hyperlipidemia, unspecified: Secondary | ICD-10-CM | POA: Diagnosis not present

## 2017-03-31 DIAGNOSIS — I10 Essential (primary) hypertension: Secondary | ICD-10-CM | POA: Diagnosis not present

## 2017-03-31 DIAGNOSIS — E119 Type 2 diabetes mellitus without complications: Secondary | ICD-10-CM | POA: Diagnosis not present

## 2017-03-31 DIAGNOSIS — N521 Erectile dysfunction due to diseases classified elsewhere: Secondary | ICD-10-CM

## 2017-03-31 DIAGNOSIS — D126 Benign neoplasm of colon, unspecified: Secondary | ICD-10-CM | POA: Diagnosis not present

## 2017-03-31 NOTE — Assessment & Plan Note (Signed)
Controlled, no change in medication Roger Prince is reminded of the importance of commitment to daily physical activity for 30 minutes or more, as able and the need to limit carbohydrate intake to 30 to 60 grams per meal to help with blood sugar control.   The need to take medication as prescribed, test blood sugar as directed, and to call between visits if there is a concern that blood sugar is uncontrolled is also discussed.   Roger Prince is reminded of the importance of daily foot exam, annual eye examination, and good blood sugar, blood pressure and cholesterol control.  Diabetic Labs Latest Ref Rng & Units 03/27/2017 07/03/2016 01/03/2016 08/21/2015 02/27/2015  HbA1c <5.7 % of total Hgb 6.6(H) 6.4(H) - 6.8(H) 6.6(H)  Microalbumin Not estab mg/dL - - - 0.2 -  Micro/Creat Ratio <30 mcg/mg creat - - - 2 -  Chol <200 mg/dL 200(H) - - 143 164  HDL >40 mg/dL 43 - - 43 38(L)  Calc LDL <130 mg/dL - - - 75 98  Triglycerides <150 mg/dL 114 - - 126 140  Creatinine 0.70 - 1.33 mg/dL 1.10 1.12 1.33(H) 1.02 1.08   BP/Weight 03/31/2017 07/31/2016 01/17/2016 01/05/2016 01/03/2016 08/31/2015 90/06/90  Systolic BP 330 076 226 98 333 545 625  Diastolic BP 80 80 80 64 78 82 80  Wt. (Lbs) 255 258.04 248 249 237 260 254  BMI 37.66 38.11 36.62 36.77 35 38.38 37.49   Foot/eye exam completion dates 07/31/2016 08/31/2015  Foot Form Completion Done Done  Refer for diabetic eye exam

## 2017-03-31 NOTE — Patient Instructions (Addendum)
Physical exam in 5 months, call if you need me before  Please reduce fried and fatty foods and commit to taking lovastatin every [place] IS THE ONLY HEALTHY DRINK, EAT FRUIT AND DRINK WATER  BLOOD PRESSURE IS GOOD TODAY  YOU ARE REFERRED FOR YOUR COLONOSCOPY T dR fIELDS, THIS IS DUE  It is important that you exercise regularly at least 30 minutes 5 times a week. If you develop chest pain, have severe difficulty breathing, or feel very tired, stop exercising immediately and seek medical attention    Please work on good  health habits so that your health will improve. 1. Commitment to daily physical activity for 30 to 60  minutes, if you are able to do this.  2. Commitment to wise food choices. Aim for half of your  food intake to be vegetable and fruit, one quarter starchy foods, and one quarter protein. Try to eat on a regular schedule  3 meals per day, snacking between meals should be limited to vegetables or fruits or small portions of nuts. 64 ounces of water per day is generally recommended, unless you have specific health conditions, like heart failure or kidney failure where you will need to limit fluid intake.  3. Commitment to sufficient and a  good quality of physical and mental rest daily, generally between 6 to 8 hours per day.  WITH PERSISTANCE AND PERSEVERANCE, THE IMPOSSIBLE , BECOMES THE NORM!

## 2017-04-01 ENCOUNTER — Encounter: Payer: Self-pay | Admitting: Gastroenterology

## 2017-04-03 ENCOUNTER — Encounter: Payer: Self-pay | Admitting: Gastroenterology

## 2017-04-06 ENCOUNTER — Encounter: Payer: Self-pay | Admitting: Family Medicine

## 2017-04-06 NOTE — Assessment & Plan Note (Signed)
Reports little response to medication which is very expensive, not motivated to do much about thios at this time

## 2017-04-06 NOTE — Assessment & Plan Note (Signed)
Hyperlipidemia:Low fat diet discussed and encouraged.   Lipid Panel  Lab Results  Component Value Date   CHOL 200 (H) 03/27/2017   HDL 43 03/27/2017   LDLCALC 75 08/21/2015   TRIG 114 03/27/2017   CHOLHDL 4.7 03/27/2017    No med change, needs to lower fatty food intake , cholesterol is elevated

## 2017-04-06 NOTE — Assessment & Plan Note (Signed)
Deteriorated. Patient re-educated about  the importance of commitment to a  minimum of 150 minutes of exercise per week.  The importance of healthy food choices with portion control discussed. Encouraged to start a food diary, count calories and to consider  joining a support group. Sample diet sheets offered. Goals set by the patient for the next several months.   Weight /BMI 03/31/2017 07/31/2016 01/17/2016  WEIGHT 255 lb 258 lb 0.6 oz 248 lb  HEIGHT 5\' 9"  5\' 9"  5\' 9"   BMI 37.66 kg/m2 38.11 kg/m2 36.62 kg/m2

## 2017-04-06 NOTE — Progress Notes (Signed)
Roger Prince     MRN: [id number]      DOB: 26-Dec-1960   HPI Roger Prince is here for follow up and re-evaluation of chronic medical conditions, medication management and review of any available recent lab and radiology data.  Preventive health is updated, specifically  Cancer screening and Immunization.   Questions or concerns regarding consultations or procedures which the PT has had in the interim are  addressed. The PT denies any adverse reactions to current medications since the last visit.  There are no new concerns.  There are no specific complaints  Denies polyuria, polydipsia, blurred vision , or hypoglycemic episodes.   ROS Denies recent fever or chills. Denies sinus pressure, nasal congestion, ear pain or sore throat. Denies chest congestion, productive cough or wheezing. Denies chest pains, palpitations and leg swelling Denies abdominal pain, nausea, vomiting,diarrhea or constipation.   Denies dysuria, frequency, hesitancy or incontinence. Denies joint pain, swelling and limitation in mobility. Denies headaches, seizures, numbness, or tingling. Denies depression, anxiety or insomnia. Denies skin break down or rash.   PE  BP 118/80 (BP Location: Left Arm, Patient Position: Sitting, Cuff Size: Large)   Pulse 92   Temp 98.8 F (37.1 C) (Temporal)   Resp 18   Ht 5\' 9"  (1.753 m)   Wt 255 lb (115.7 kg)   SpO2 97%   BMI 37.66 kg/m   Patient alert and oriented and in no cardiopulmonary distress.  HEENT: No facial asymmetry, EOMI,   oropharynx pink and moist.  Neck supple no JVD, no mass.  Chest: Clear to auscultation bilaterally.  CVS: S1, S2 no murmurs, no S3.Regular rate.  ABD: Soft non tender.   Ext: No edema  MS: Adequate ROM spine, shoulders, hips and knees.  Skin: Intact, no ulcerations or rash noted.  Psych: Good eye contact, normal affect. Memory intact not anxious or depressed appearing.  CNS: CN 2-12 intact, power,  normal throughout.no focal deficits  noted.   Assessment & Plan  Type 2 diabetes mellitus with hemoglobin A1c goal of less than 7.0% (HCC) Controlled, no change in medication Roger Prince is reminded of the importance of commitment to daily physical activity for 30 minutes or more, as able and the need to limit carbohydrate intake to 30 to 60 grams per meal to help with blood sugar control.   The need to take medication as prescribed, test blood sugar as directed, and to call between visits if there is a concern that blood sugar is uncontrolled is also discussed.   Roger Prince is reminded of the importance of daily foot exam, annual eye examination, and good blood sugar, blood pressure and cholesterol control.  Diabetic Labs Latest Ref Rng & Units 03/27/2017 07/03/2016 01/03/2016 08/21/2015 02/27/2015  HbA1c <5.7 % of total Hgb 6.6(H) 6.4(H) - 6.8(H) 6.6(H)  Microalbumin Not estab mg/dL - - - 0.2 -  Micro/Creat Ratio <30 mcg/mg creat - - - 2 -  Chol <200 mg/dL 200(H) - - 143 164  HDL >40 mg/dL 43 - - 43 38(L)  Calc LDL <130 mg/dL - - - 75 98  Triglycerides <150 mg/dL 114 - - 126 140  Creatinine 0.70 - 1.33 mg/dL 1.10 1.12 1.33(H) 1.02 1.08   BP/Weight 03/31/2017 07/31/2016 01/17/2016 01/05/2016 01/03/2016 08/31/2015 32/12/5186  Systolic BP 416 606 301 98 601 093 235  Diastolic BP 80 80 80 64 78 82 80  Wt. (Lbs) 255 258.04 248 249 237 260 254  BMI 37.66 38.11 36.62 36.77 35 38.38  37.49   Foot/eye exam completion dates 07/31/2016 08/31/2015  Foot Form Completion Done Done  Refer for diabetic eye exam      Essential hypertension Controlled, no change in medication DASH diet and commitment to daily physical activity for a minimum of 30 minutes discussed and encouraged, as a part of hypertension management. The importance of attaining a healthy weight is also discussed.  BP/Weight 03/31/2017 07/31/2016 01/17/2016 01/05/2016 01/03/2016 08/31/2015 71/05/4578  Systolic BP 998 338 250 98 539 767 341  Diastolic BP 80 80 80 64 78 82 80  Wt. (Lbs) 255 258.04  248 249 237 260 254  BMI 37.66 38.11 36.62 36.77 35 38.38 37.49       Tubular adenoma of colon Repeat colonoscopy is due , referral made and the importance of follow through isa discussed at the visit  Morbid obesity Deteriorated. Patient re-educated about  the importance of commitment to a  minimum of 150 minutes of exercise per week.  The importance of healthy food choices with portion control discussed. Encouraged to start a food diary, count calories and to consider  joining a support group. Sample diet sheets offered. Goals set by the patient for the next several months.   Weight /BMI 03/31/2017 07/31/2016 01/17/2016  WEIGHT 255 lb 258 lb 0.6 oz 248 lb  HEIGHT 5\' 9"  5\' 9"  5\' 9"   BMI 37.66 kg/m2 38.11 kg/m2 36.62 kg/m2      Hyperlipidemia with target LDL less than 100 Hyperlipidemia:Low fat diet discussed and encouraged.   Lipid Panel  Lab Results  Component Value Date   CHOL 200 (H) 03/27/2017   HDL 43 03/27/2017   LDLCALC 75 08/21/2015   TRIG 114 03/27/2017   CHOLHDL 4.7 03/27/2017    No med change, needs to lower fatty food intake , cholesterol is elevated   ED (erectile dysfunction) Reports little response to medication which is very expensive, not motivated to do much about thios at this time

## 2017-04-06 NOTE — Assessment & Plan Note (Signed)
Repeat colonoscopy is due , referral made and the importance of follow through isa discussed at the visit

## 2017-04-06 NOTE — Assessment & Plan Note (Signed)
Controlled, no change in medication DASH diet and commitment to daily physical activity for a minimum of 30 minutes discussed and encouraged, as a part of hypertension management. The importance of attaining a healthy weight is also discussed.  BP/Weight 03/31/2017 07/31/2016 01/17/2016 01/05/2016 01/03/2016 08/31/2015 52/11/4130  Systolic BP 440 102 725 98 366 440 347  Diastolic BP 80 80 80 64 78 82 80  Wt. (Lbs) 255 258.04 248 249 237 260 254  BMI 37.66 38.11 36.62 36.77 35 38.38 37.49

## 2017-04-09 ENCOUNTER — Encounter: Payer: Self-pay | Admitting: Gastroenterology

## 2017-04-10 IMAGING — DX DG LUMBAR SPINE COMPLETE 4+V
5 series · 5 of 5 positions shown · non-contrast
Comparison: CT 05/02/2013

CLINICAL DATA: Low back pain after picking up child 2 days ago.
Pain radiates down left leg.

EXAM:
LUMBAR SPINE - COMPLETE 4+ VIEW

[l-spine ap]
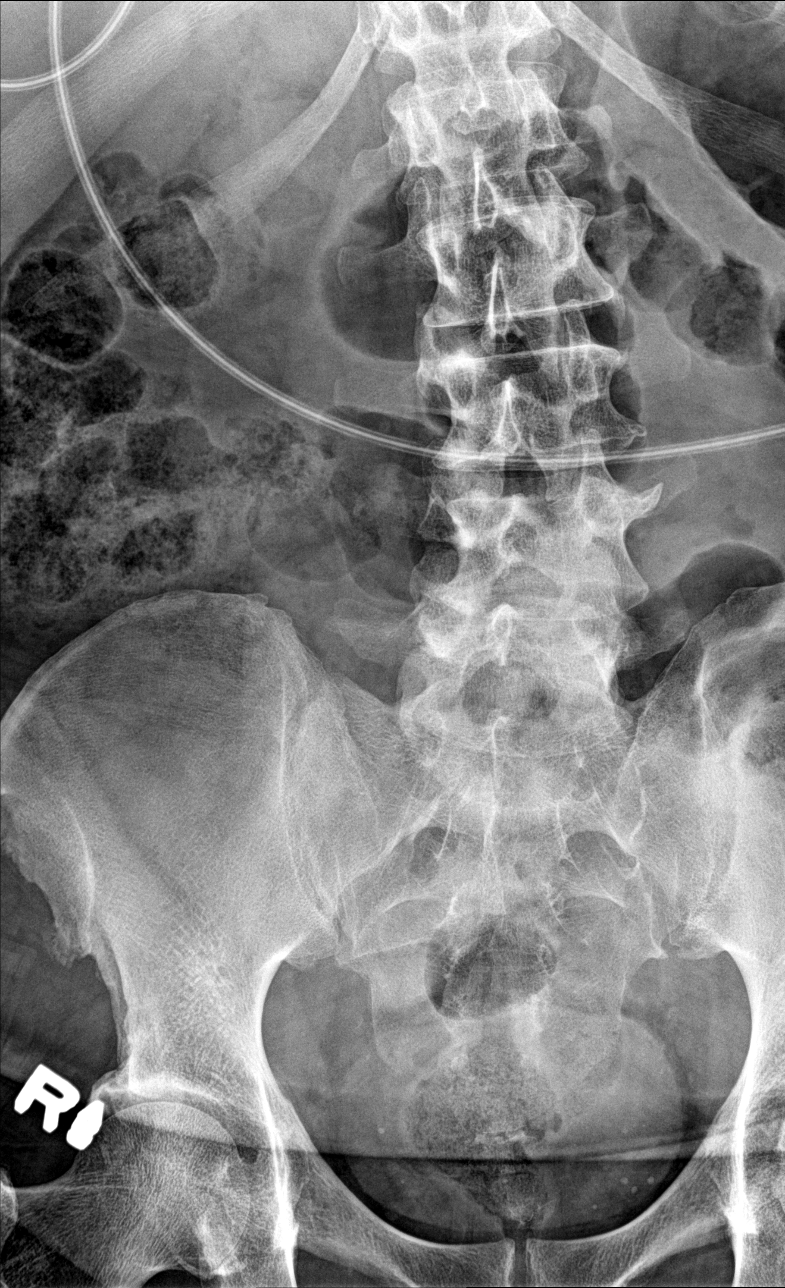

[l-spine obl (1 of 2)]
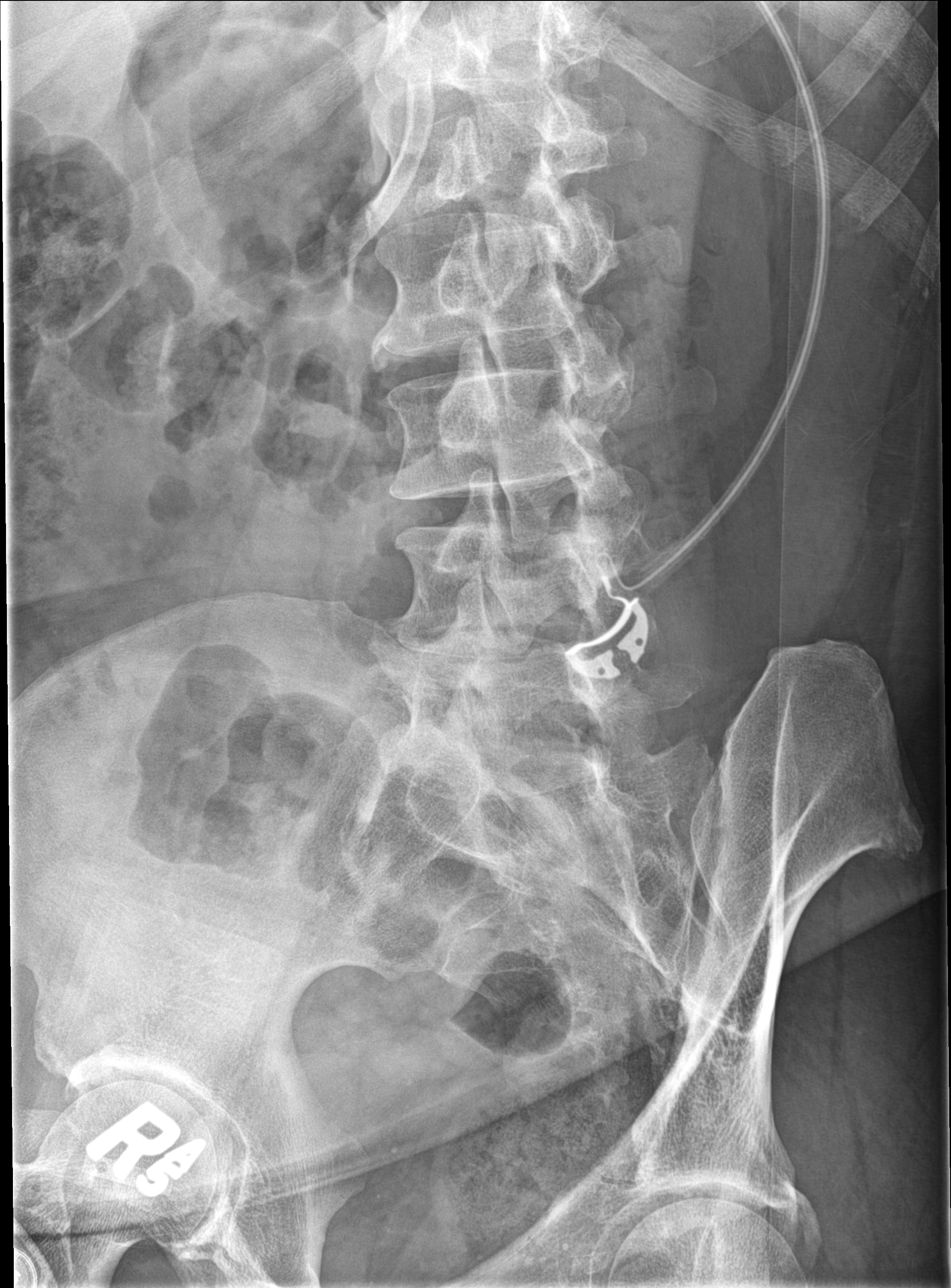

[l-spine obl (2 of 2)]
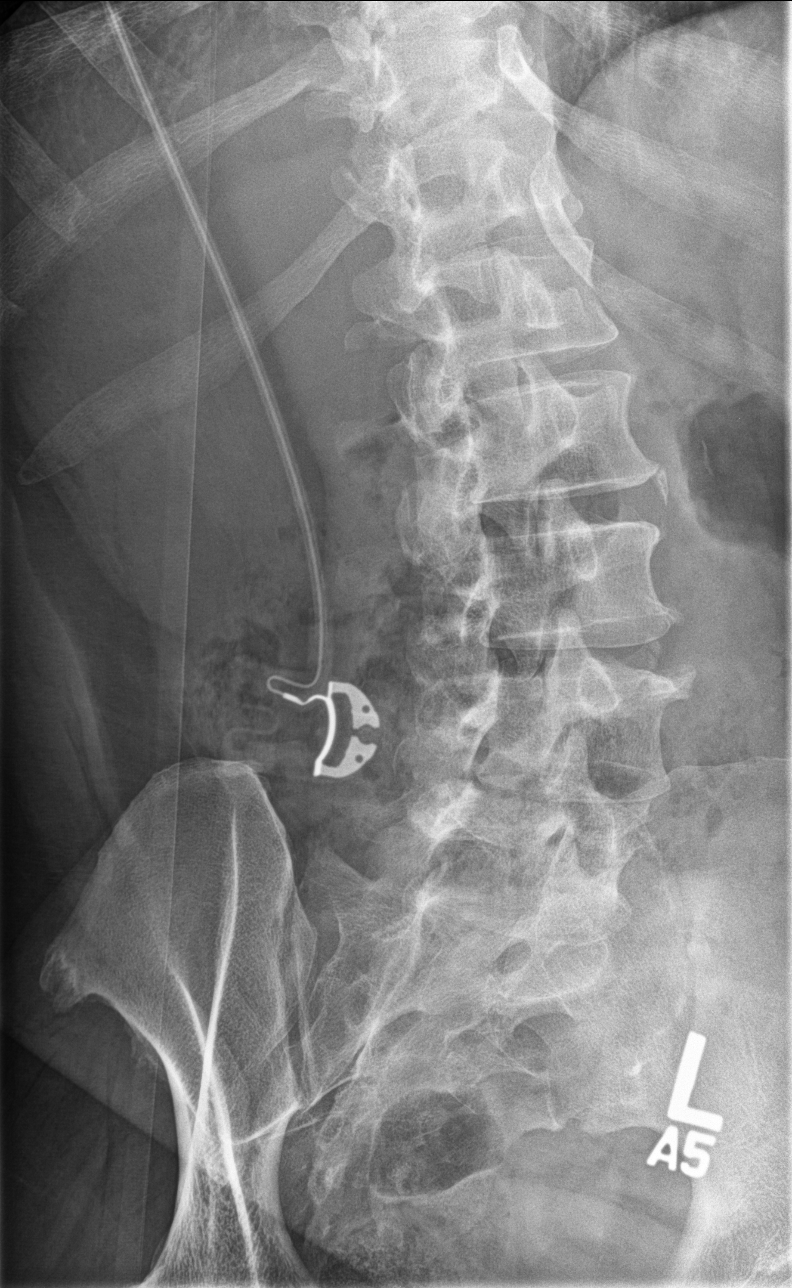

[l-spine lat]
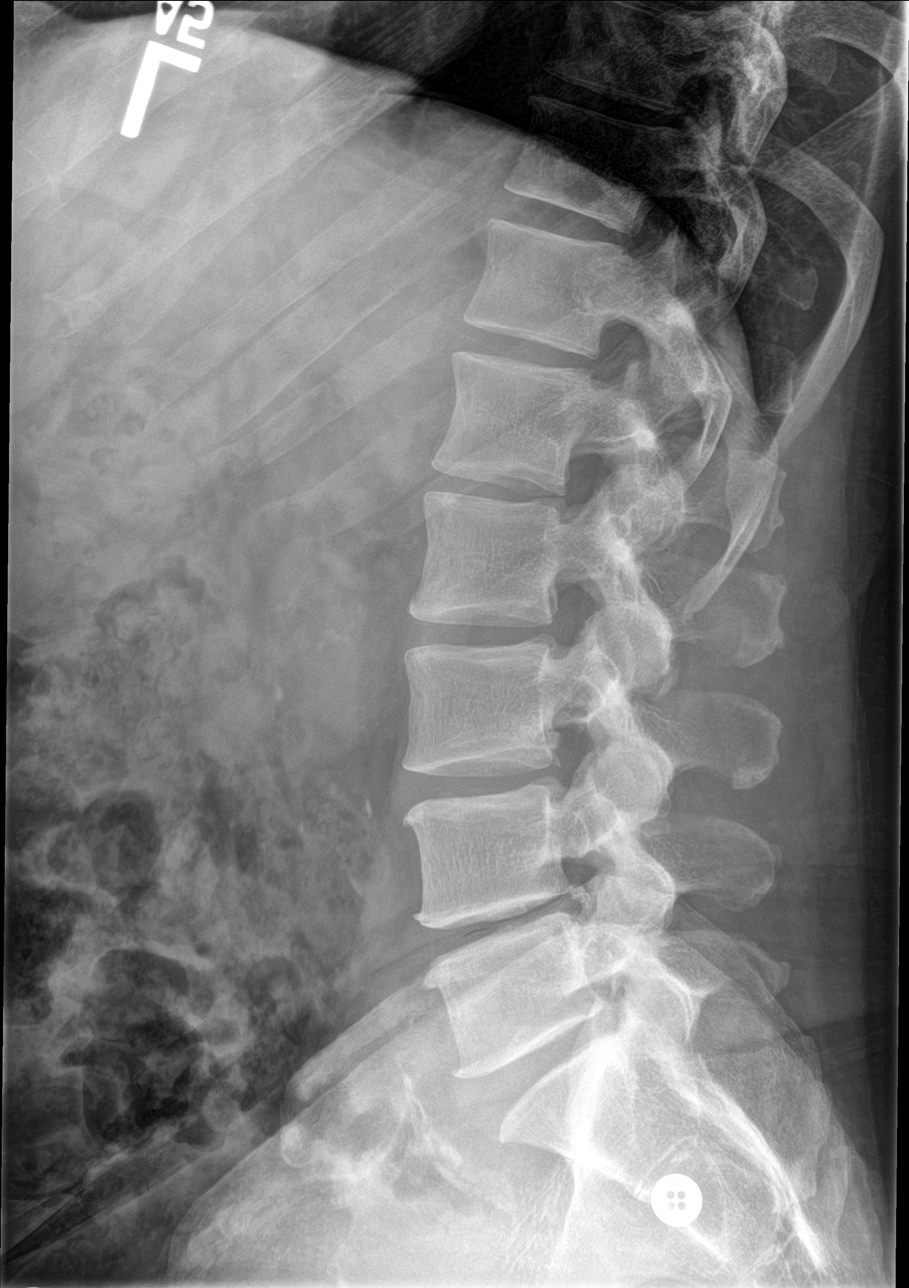

[l-spine spot]
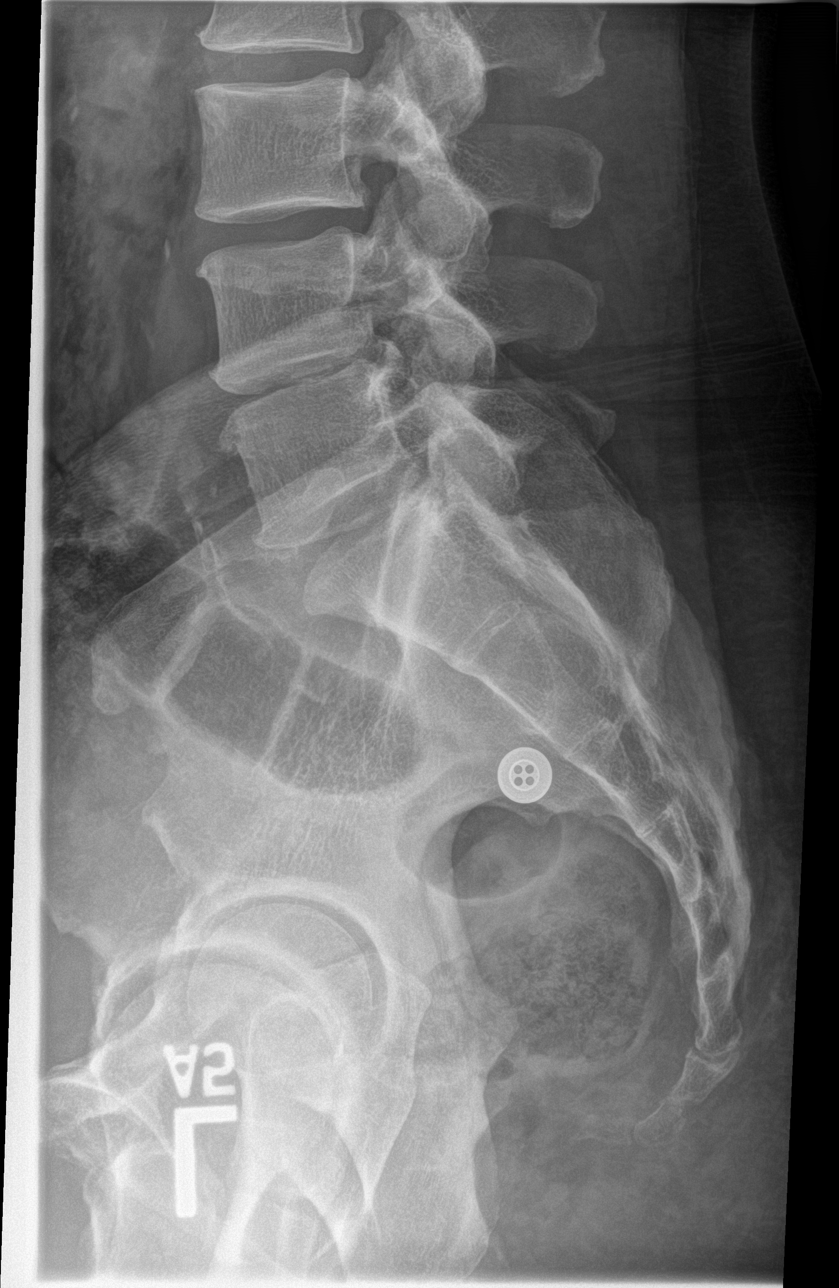

[5 of 5 positions shown; findings below may reference images not displayed]

FINDINGS: Degenerative disc disease changes at L4-5 with disc space narrowing
and spurring. Remainder disc spaces are maintained. Mild
levoscoliosis in the lower lumbar spine. No fracture. SI joints are
symmetric and unremarkable.
IMPRESSION: Degenerative changes at L4-5. Mild levoscoliosis. No acute findings.

## 2017-05-22 ENCOUNTER — Encounter: Payer: Self-pay | Admitting: Gastroenterology

## 2017-05-22 ENCOUNTER — Telehealth: Payer: Self-pay | Admitting: Gastroenterology

## 2017-05-22 ENCOUNTER — Ambulatory Visit: Payer: BLUE CROSS/BLUE SHIELD | Admitting: Gastroenterology

## 2017-05-22 NOTE — Telephone Encounter (Signed)
REVIEWED-NO ADDITIONAL RECOMMENDATIONS. 

## 2017-05-22 NOTE — Telephone Encounter (Signed)
PATIENT WAS A NO SHOW AND LETTER SENT  °

## 2017-09-01 ENCOUNTER — Encounter: Payer: BLUE CROSS/BLUE SHIELD | Admitting: Family Medicine

## 2017-09-06 ENCOUNTER — Other Ambulatory Visit: Payer: Self-pay | Admitting: Family Medicine

## 2017-09-09 ENCOUNTER — Other Ambulatory Visit: Payer: Self-pay | Admitting: Family Medicine

## 2018-02-10 ENCOUNTER — Other Ambulatory Visit: Payer: Self-pay | Admitting: Family Medicine

## 2018-02-11 ENCOUNTER — Telehealth: Payer: Self-pay

## 2018-02-11 ENCOUNTER — Ambulatory Visit (INDEPENDENT_AMBULATORY_CARE_PROVIDER_SITE_OTHER): Payer: BLUE CROSS/BLUE SHIELD

## 2018-02-11 ENCOUNTER — Telehealth: Payer: Self-pay | Admitting: Family Medicine

## 2018-02-11 DIAGNOSIS — Z23 Encounter for immunization: Secondary | ICD-10-CM | POA: Diagnosis not present

## 2018-02-11 DIAGNOSIS — N521 Erectile dysfunction due to diseases classified elsewhere: Secondary | ICD-10-CM

## 2018-02-11 DIAGNOSIS — E785 Hyperlipidemia, unspecified: Secondary | ICD-10-CM

## 2018-02-11 DIAGNOSIS — E119 Type 2 diabetes mellitus without complications: Secondary | ICD-10-CM

## 2018-02-11 DIAGNOSIS — I10 Essential (primary) hypertension: Secondary | ICD-10-CM

## 2018-02-11 NOTE — Progress Notes (Signed)
Patient received the flu vaccine in left arm with no complications

## 2018-02-11 NOTE — Telephone Encounter (Signed)
Labs ordered.

## 2018-02-11 NOTE — Telephone Encounter (Signed)
Pt is coming in for CPE --Does he need labs --if so please send to [facility]

## 2018-02-13 NOTE — Telephone Encounter (Signed)
Labs ordered. Tried to notify patient. Vm hasn't been set up.

## 2018-02-17 NOTE — Telephone Encounter (Signed)
[facility]. Will make pt aware if he calls back. [facility] as requested

## 2018-03-02 ENCOUNTER — Encounter: Payer: BLUE CROSS/BLUE SHIELD | Admitting: Family Medicine

## 2018-03-03 ENCOUNTER — Other Ambulatory Visit: Payer: Self-pay | Admitting: Family Medicine

## 2018-03-06 ENCOUNTER — Telehealth: Payer: Self-pay | Admitting: *Deleted

## 2018-03-06 ENCOUNTER — Other Ambulatory Visit: Payer: Self-pay

## 2018-03-06 MED ORDER — ASPIRIN EC 81 MG PO TBEC: 81.0000 mg | DELAYED_RELEASE_TABLET | Freq: Every day | ORAL | 3 refills | Status: AC

## 2018-03-06 MED ORDER — BENAZEPRIL HCL 20 MG PO TABS: 20.0000 mg | ORAL_TABLET | Freq: Every day | ORAL | 0 refills | Status: DC

## 2018-03-06 MED ORDER — LOVASTATIN 20 MG PO TABS: 20.0000 mg | ORAL_TABLET | Freq: Every day | ORAL | 0 refills | Status: DC

## 2018-03-06 NOTE — Progress Notes (Signed)
Refills sent

## 2018-03-06 NOTE — Telephone Encounter (Signed)
Pt needs refill on two blood pressure meds and metformin  Pt has enough to last around 4 days.

## 2018-03-10 ENCOUNTER — Encounter: Payer: BLUE CROSS/BLUE SHIELD | Admitting: Family Medicine

## 2018-05-23 ENCOUNTER — Other Ambulatory Visit: Payer: Self-pay | Admitting: Family Medicine

## 2018-08-05 ENCOUNTER — Other Ambulatory Visit: Payer: Self-pay | Admitting: Family Medicine

## 2018-09-02 ENCOUNTER — Other Ambulatory Visit: Payer: Self-pay | Admitting: Family Medicine

## 2018-09-08 ENCOUNTER — Other Ambulatory Visit: Payer: Self-pay | Admitting: Family Medicine

## 2018-09-21 ENCOUNTER — Other Ambulatory Visit: Payer: Self-pay | Admitting: Family Medicine

## 2018-10-29 ENCOUNTER — Telehealth: Payer: Self-pay | Admitting: *Deleted

## 2018-10-29 NOTE — Telephone Encounter (Signed)
His meds was refused bc last appt 03/2017 and no upcoming appt. Needs to schedule asap, let me know and I will refill to last until appt

## 2018-10-29 NOTE — Telephone Encounter (Signed)
LVM on pt home phone telling him to call office about medication and attempted to call cell number with no way to leave a voicemail.

## 2018-10-29 NOTE — Telephone Encounter (Signed)
Pt called said he was out of both BP medications and his metformin. Said he needs them [facility] he is completely out.

## 2018-11-12 ENCOUNTER — Other Ambulatory Visit: Payer: Self-pay

## 2018-11-12 MED ORDER — TRIAMTERENE-HCTZ 37.5-25 MG PO TABS: 1.0000 | ORAL_TABLET | Freq: Every day | ORAL | 0 refills | Status: DC

## 2018-11-12 MED ORDER — METFORMIN HCL ER 500 MG PO TB24: 500.0000 mg | ORAL_TABLET | Freq: Every day | ORAL | 0 refills | Status: DC

## 2018-11-12 MED ORDER — BENAZEPRIL HCL 20 MG PO TABS: 20.0000 mg | ORAL_TABLET | Freq: Every day | ORAL | 0 refills | Status: DC

## 2018-11-12 NOTE — Telephone Encounter (Signed)
15 days of meds called in only

## 2018-11-12 NOTE — Telephone Encounter (Signed)
Pt wife made appt for [place] 7-27 at 8:40 wanted to see if she would refill medication until he comes in

## 2018-11-23 ENCOUNTER — Ambulatory Visit (INDEPENDENT_AMBULATORY_CARE_PROVIDER_SITE_OTHER): Payer: BLUE CROSS/BLUE SHIELD | Admitting: Family Medicine

## 2018-11-23 ENCOUNTER — Other Ambulatory Visit: Payer: Self-pay

## 2018-11-23 ENCOUNTER — Encounter: Payer: Self-pay | Admitting: Family Medicine

## 2018-11-23 VITALS — BP 138/86 | HR 100 | Resp 12 | Ht 69.5 in | Wt 274.1 lb

## 2018-11-23 DIAGNOSIS — D126 Benign neoplasm of colon, unspecified: Secondary | ICD-10-CM | POA: Diagnosis not present

## 2018-11-23 DIAGNOSIS — E119 Type 2 diabetes mellitus without complications: Secondary | ICD-10-CM

## 2018-11-23 DIAGNOSIS — Z125 Encounter for screening for malignant neoplasm of prostate: Secondary | ICD-10-CM

## 2018-11-23 DIAGNOSIS — I10 Essential (primary) hypertension: Secondary | ICD-10-CM

## 2018-11-23 DIAGNOSIS — B351 Tinea unguium: Secondary | ICD-10-CM

## 2018-11-23 DIAGNOSIS — E785 Hyperlipidemia, unspecified: Secondary | ICD-10-CM

## 2018-11-23 DIAGNOSIS — Z23 Encounter for immunization: Secondary | ICD-10-CM

## 2018-11-23 MED ORDER — BENAZEPRIL HCL 20 MG PO TABS: 20.0000 mg | ORAL_TABLET | Freq: Every day | ORAL | 3 refills | Status: DC

## 2018-11-23 NOTE — Assessment & Plan Note (Signed)
Repeat overdue , will refer

## 2018-11-23 NOTE — Assessment & Plan Note (Signed)
Uncontrolled, out of medication, med renewed DASH diet and commitment to daily physical activity for a minimum of 30 minutes discussed and encouraged, as a part of hypertension management. The importance of attaining a healthy weight is also discussed.  BP/Weight 11/23/2018 03/31/2017 07/31/2016 01/17/2016 01/05/2016 08/03/3401 7/0/9643  Systolic BP 838 184 037 543 98 606 770  Diastolic BP 86 80 80 80 64 78 82  Wt. (Lbs) 274.08 255 258.04 248 249 237 260  BMI 39.89 37.66 38.11 36.62 36.77 35 38.38

## 2018-11-23 NOTE — Assessment & Plan Note (Signed)
Bilateral, mainly great toes, will hod medication pending result of hepatic panel

## 2018-11-23 NOTE — Assessment & Plan Note (Signed)
Hyperlipidemia:Low fat diet discussed and encouraged.   Lipid Panel  Lab Results  Component Value Date   CHOL 200 (H) 03/27/2017   HDL 43 03/27/2017   LDLCALC 135 (H) 03/27/2017   TRIG 114 03/27/2017   CHOLHDL 4.7 03/27/2017  uncontrolled however  Needs updated lab

## 2018-11-23 NOTE — Addendum Note (Signed)
Addended by: Eual Fines on: 11/23/2018 01:40 PM   Modules accepted: Orders

## 2018-11-23 NOTE — Progress Notes (Signed)
Roger Prince     MRN: [id number]      DOB: Nov 20, 1960   HPI [name] is here for follow up and re-evaluation of chronic medical conditions, medication management and review of any available recent lab and radiology data. hAS NOT HAD EVAL FOR OVER 18 MONTHS,STATES HE FEELS WELL, HAD MINOR BACK PROBLEM BUT WAS NOT EVALUATED HERE FOR THIS Preventive health is updated, specifically  Cancer screening and Immunization.   The PT denies any adverse reactions to current medications since the last visit.  There are no new concerns.  There are no specific complaints   ROS Denies recent fever or chills. Denies sinus pressure, nasal congestion, ear pain or sore throat. Denies chest congestion, productive cough or wheezing. Denies chest pains, palpitations and leg swelling Denies abdominal pain, nausea, vomiting,diarrhea or constipation.   Denies dysuria, frequency, hesitancy or incontinence. Denies joint pain, swelling and limitation in mobility. Denies headaches, seizures, numbness, or tingling. Denies depression, anxiety or insomnia. Denies skin break down or rash.   PE  BP 138/86   Pulse 100   Resp 12   Ht 5' 9.5" (1.765 m)   Wt 274 lb 1.3 oz (124.3 kg)   SpO2 95%   BMI 39.89 kg/m   Patient alert and oriented and in no cardiopulmonary distress.  HEENT: No facial asymmetry, EOMI,   oropharynx pink and moist.  Neck supple no JVD, no mass.  Chest: Clear to auscultation bilaterally.  CVS: S1, S2 no murmurs, no S3.Regular rate.  ABD: Soft non tender.   Ext: No edema  MS: Adequate ROM spine, shoulders, hips and knees.  Skin: Intact, no ulcerations or rash noted.bilateral onychomycosis  Psych: Good eye contact, normal affect. Memory intact not anxious or depressed appearing.  CNS: CN 2-12 intact, power,  normal throughout.no focal deficits noted.   Assessment & Plan  Type 2 diabetes mellitus with hemoglobin A1c goal of less than 7.0% ([place]) Updated lab needed  [name] is  reminded of the importance of commitment to daily physical activity for 30 minutes or more, as able and the need to limit carbohydrate intake to 30 to 60 grams per meal to help with blood sugar control.   The need to take medication as prescribed, test blood sugar as directed, and to call between visits if there is a concern that blood sugar is uncontrolled is also discussed.   [name] is reminded of the importance of daily foot exam, annual eye examination, and good blood sugar, blood pressure and cholesterol control.  Diabetic Labs Latest Ref Rng & Units 03/27/2017 07/03/2016 01/03/2016 08/21/2015 02/27/2015  HbA1c <5.7 % of total Hgb 6.6(H) 6.4(H) - 6.8(H) 6.6(H)  Microalbumin Not estab mg/dL - - - 0.2 -  Micro/Creat Ratio <30 mcg/mg creat - - - 2 -  Chol <200 mg/dL 200(H) - - 143 164  HDL >40 mg/dL 43 - - 43 38(L)  Calc LDL mg/dL (calc) 135(H) - - 75 98  Triglycerides <150 mg/dL 114 - - 126 140  Creatinine 0.70 - 1.33 mg/dL 1.10 1.12 1.33(H) 1.02 1.08   BP/Weight 11/23/2018 03/31/2017 07/31/2016 01/17/2016 01/05/2016 0/05/5425 0/09/2374  Systolic BP 283 151 761 607 98 371 062  Diastolic BP 86 80 80 80 64 78 82  Wt. (Lbs) 274.08 255 258.04 248 249 237 260  BMI 39.89 37.66 38.11 36.62 36.77 35 38.38   Foot/eye exam completion dates 11/23/2018 07/31/2016  Foot Form Completion Done Done        Tubular  adenoma of colon Repeat overdue , will refer  Essential hypertension Uncontrolled, out of medication, med renewed DASH diet and commitment to daily physical activity for a minimum of 30 minutes discussed and encouraged, as a part of hypertension management. The importance of attaining a healthy weight is also discussed.  BP/Weight 11/23/2018 03/31/2017 07/31/2016 01/17/2016 01/05/2016 08/03/5679 06/05/5168  Systolic BP 017 494 496 759 98 163 846  Diastolic BP 86 80 80 80 64 78 82  Wt. (Lbs) 274.08 255 258.04 248 249 237 260  BMI 39.89 37.66 38.11 36.62 36.77 35 38.38       Morbid obesity Obesity  associated with hypertension and diabetes  Patient re-educated about  the importance of commitment to a  minimum of 150 minutes of exercise per week as able.  The importance of healthy food choices with portion control discussed, as well as eating regularly and within a 12 hour window most days. The need to choose "clean , green" food 50 to 75% of the time is discussed, as well as to make water the primary drink and set a goal of 64 ounces water daily.    Weight /BMI 11/23/2018 03/31/2017 07/31/2016  WEIGHT 274 lb 1.3 oz 255 lb 258 lb 0.6 oz  HEIGHT 5' 9.5" 5\' 9"  5\' 9"   BMI 39.89 kg/m2 37.66 kg/m2 38.11 kg/m2      Onychomycosis Bilateral, mainly great toes, will hod medication pending result of hepatic panel  Hyperlipidemia with target LDL less than 100 Hyperlipidemia:Low fat diet discussed and encouraged.   Lipid Panel  Lab Results  Component Value Date   CHOL 200 (H) 03/27/2017   HDL 43 03/27/2017   LDLCALC 135 (H) 03/27/2017   TRIG 114 03/27/2017   CHOLHDL 4.7 03/27/2017  uncontrolled however  Needs updated lab     Need for Tdap vaccination After obtaining informed consent, the vaccine is  administered , with no adverse effect noted at the time of administration.

## 2018-11-23 NOTE — Assessment & Plan Note (Signed)
Obesity associated with hypertension and diabetes  Patient re-educated about  the importance of commitment to a  minimum of 150 minutes of exercise per week as able.  The importance of healthy food choices with portion control discussed, as well as eating regularly and within a 12 hour window most days. The need to choose "clean , green" food 50 to 75% of the time is discussed, as well as to make water the primary drink and set a goal of 64 ounces water daily.    Weight /BMI 11/23/2018 03/31/2017 07/31/2016  WEIGHT 274 lb 1.3 oz 255 lb 258 lb 0.6 oz  HEIGHT 5' 9.5" 5\' 9"  5\' 9"   BMI 39.89 kg/m2 37.66 kg/m2 38.11 kg/m2

## 2018-11-23 NOTE — Patient Instructions (Addendum)
Annual physical exam with MD in 3 months call if you need me sooner.  Tdap today at visit.  Foot exam today is good except for the fungal toenails which are long.  Medication oral medication to treat this would be prescribed once lab results are available.  Please schedule your appointment for your annual diabetic eye exam this is overdue.  You are being referred for repeat colonoscopy which is overdue it is important that you follow through with this.  Labs asap are CBC fasting lipid ,CMP and  EGFR , TSH ,PSA hemoglobin A1c and microalbuminuria.  Your diabetic medication and medicine for cholesterol as well as for fungal infection will be prescribed once labs are available.  Commitment to healthy lifestyle which involves daily exercise as well as mindful eating to improve your health is vital.  Thank you

## 2018-11-23 NOTE — Assessment & Plan Note (Signed)
After obtaining informed consent, the vaccine is  administered , with no adverse effect noted at the time of administration.  

## 2018-11-23 NOTE — Assessment & Plan Note (Addendum)
Updated lab needed  Mr. Roger Prince is reminded of the importance of commitment to daily physical activity for 30 minutes or more, as able and the need to limit carbohydrate intake to 30 to 60 grams per meal to help with blood sugar control.   The need to take medication as prescribed, test blood sugar as directed, and to call between visits if there is a concern that blood sugar is uncontrolled is also discussed.   Mr. Roger Prince is reminded of the importance of daily foot exam, annual eye examination, and good blood sugar, blood pressure and cholesterol control.  Diabetic Labs Latest Ref Rng & Units 03/27/2017 07/03/2016 01/03/2016 08/21/2015 02/27/2015  HbA1c <5.7 % of total Hgb 6.6(H) 6.4(H) - 6.8(H) 6.6(H)  Microalbumin Not estab mg/dL - - - 0.2 -  Micro/Creat Ratio <30 mcg/mg creat - - - 2 -  Chol <200 mg/dL 200(H) - - 143 164  HDL >40 mg/dL 43 - - 43 38(L)  Calc LDL mg/dL (calc) 135(H) - - 75 98  Triglycerides <150 mg/dL 114 - - 126 140  Creatinine 0.70 - 1.33 mg/dL 1.10 1.12 1.33(H) 1.02 1.08   BP/Weight 11/23/2018 03/31/2017 07/31/2016 01/17/2016 01/05/2016 0/12/4074 8/0/8811  Systolic BP 031 594 585 929 98 244 628  Diastolic BP 86 80 80 80 64 78 82  Wt. (Lbs) 274.08 255 258.04 248 249 237 260  BMI 39.89 37.66 38.11 36.62 36.77 35 38.38   Foot/eye exam completion dates 11/23/2018 07/31/2016  Foot Form Completion Done Done

## 2018-11-24 DIAGNOSIS — Z125 Encounter for screening for malignant neoplasm of prostate: Secondary | ICD-10-CM | POA: Diagnosis not present

## 2018-11-24 DIAGNOSIS — I10 Essential (primary) hypertension: Secondary | ICD-10-CM | POA: Diagnosis not present

## 2018-11-24 DIAGNOSIS — E119 Type 2 diabetes mellitus without complications: Secondary | ICD-10-CM | POA: Diagnosis not present

## 2018-11-25 ENCOUNTER — Other Ambulatory Visit: Payer: Self-pay | Admitting: Family Medicine

## 2018-11-25 ENCOUNTER — Encounter: Payer: Self-pay | Admitting: Family Medicine

## 2018-11-25 LAB — COMPLETE METABOLIC PANEL WITH GFR
AG Ratio: 1.4 (calc) (ref 1.0–2.5)
ALT: 18 U/L (ref 9–46)
AST: 23 U/L (ref 10–35)
Albumin: 4.1 g/dL (ref 3.6–5.1)
Alkaline phosphatase (APISO): 58 U/L (ref 35–144)
BUN: 13 mg/dL (ref 7–25)
CO2: 23 mmol/L (ref 20–32)
Calcium: 9.8 mg/dL (ref 8.6–10.3)
Chloride: 103 mmol/L (ref 98–110)
Creat: 0.97 mg/dL (ref 0.70–1.33)
GFR, Est African American: 100 mL/min/{1.73_m2} (ref >=60)
GFR, Est Non African American: 86 mL/min/{1.73_m2} (ref >=60)
Globulin: 3 g/dL (calc) (ref 1.9–3.7)
Glucose, Bld: 133 mg/dL — ABNORMAL HIGH (ref 65–99)
Potassium: 4.3 mmol/L (ref 3.5–5.3)
Sodium: 138 mmol/L (ref 135–146)
Total Bilirubin: 0.4 mg/dL (ref 0.2–1.2)
Total Protein: 7.1 g/dL (ref 6.1–8.1)

## 2018-11-25 LAB — LIPID PANEL
Cholesterol: 244 mg/dL — ABNORMAL HIGH (ref <200)
HDL: 43 mg/dL (ref >=40)
LDL Cholesterol (Calc): 159 mg/dL (calc) — ABNORMAL HIGH
Non-HDL Cholesterol (Calc): 201 mg/dL (calc) — ABNORMAL HIGH (ref <130)
Total CHOL/HDL Ratio: 5.7 (calc) — ABNORMAL HIGH (ref <5.0)
Triglycerides: 245 mg/dL — ABNORMAL HIGH (ref <150)

## 2018-11-25 LAB — CBC
HCT: 46.6 % (ref 38.5–50.0)
Hemoglobin: 15.3 g/dL (ref 13.2–17.1)
MCH: 27.6 pg (ref 27.0–33.0)
MCHC: 32.8 g/dL (ref 32.0–36.0)
MCV: 84 fL (ref 80.0–100.0)
MPV: 10.6 fL (ref 7.5–12.5)
Platelets: 158 10*3/uL (ref 140–400)
RBC: 5.55 10*6/uL (ref 4.20–5.80)
RDW: 13.4 % (ref 11.0–15.0)
WBC: 7.1 10*3/uL (ref 3.8–10.8)

## 2018-11-25 LAB — MICROALBUMIN / CREATININE URINE RATIO
Creatinine, Urine: 84 mg/dL (ref 20–320)
Microalb Creat Ratio: 4 mcg/mg creat (ref <30)
Microalb, Ur: 0.3 mg/dL

## 2018-11-25 LAB — HEMOGLOBIN A1C
Hgb A1c MFr Bld: 7.3 % of total Hgb — ABNORMAL HIGH (ref <5.7)
Mean Plasma Glucose: 163 (calc)
eAG (mmol/L): 9 (calc)

## 2018-11-25 LAB — PSA: PSA: 0.6 ng/mL (ref <=4.0)

## 2018-11-25 LAB — TSH: TSH: 1.36 mIU/L (ref 0.40–4.50)

## 2018-11-25 MED ORDER — METFORMIN HCL ER (OSM) 1000 MG PO TB24: 1000.0000 mg | ORAL_TABLET | Freq: Every day | ORAL | 1 refills | Status: DC

## 2018-11-25 MED ORDER — TERBINAFINE HCL 250 MG PO TABS: 250.0000 mg | ORAL_TABLET | Freq: Every day | ORAL | 1 refills | Status: DC

## 2018-11-25 MED ORDER — ROSUVASTATIN CALCIUM 20 MG PO TABS: 20.0000 mg | ORAL_TABLET | Freq: Every day | ORAL | 1 refills | Status: DC

## 2018-11-26 ENCOUNTER — Encounter: Payer: Self-pay | Admitting: Gastroenterology

## 2018-11-30 ENCOUNTER — Telehealth: Payer: Self-pay

## 2018-11-30 MED ORDER — METFORMIN HCL ER (MOD) 1000 MG PO TB24: 1000.0000 mg | ORAL_TABLET | Freq: Every day | ORAL | 3 refills | Status: DC

## 2018-11-30 NOTE — Telephone Encounter (Signed)
New prescription sent in because first one not covered under insurance.

## 2018-12-02 ENCOUNTER — Telehealth: Payer: Self-pay

## 2018-12-02 DIAGNOSIS — I10 Essential (primary) hypertension: Secondary | ICD-10-CM

## 2018-12-02 DIAGNOSIS — E785 Hyperlipidemia, unspecified: Secondary | ICD-10-CM

## 2018-12-02 DIAGNOSIS — E119 Type 2 diabetes mellitus without complications: Secondary | ICD-10-CM

## 2018-12-02 NOTE — Telephone Encounter (Signed)
Labs entered to be drawn 1 week prior to next appt

## 2019-01-07 ENCOUNTER — Telehealth: Payer: Self-pay | Admitting: *Deleted

## 2019-01-07 ENCOUNTER — Ambulatory Visit: Payer: BLUE CROSS/BLUE SHIELD

## 2019-01-07 ENCOUNTER — Encounter: Payer: Self-pay | Admitting: *Deleted

## 2019-01-07 NOTE — Telephone Encounter (Signed)
PATIENT WAS A NO SHOW AND LETTER SENT  °

## 2019-01-07 NOTE — Telephone Encounter (Signed)
Noted  

## 2019-01-25 ENCOUNTER — Other Ambulatory Visit: Payer: Self-pay

## 2019-01-25 ENCOUNTER — Telehealth: Payer: Self-pay | Admitting: *Deleted

## 2019-01-25 ENCOUNTER — Ambulatory Visit (INDEPENDENT_AMBULATORY_CARE_PROVIDER_SITE_OTHER): Payer: BLUE CROSS/BLUE SHIELD

## 2019-01-25 DIAGNOSIS — Z23 Encounter for immunization: Secondary | ICD-10-CM | POA: Diagnosis not present

## 2019-01-25 NOTE — Telephone Encounter (Signed)
Pt came by to get flu shot and said Dr. Moshe Cipro changed his medications when he came in but when he went to pick it up the insurance did not cover any of his medications. He said he was on blood pressure medications before and they worked just fine and [employer] covered it. Would like something called in his insurance covers.

## 2019-01-26 ENCOUNTER — Other Ambulatory Visit: Payer: Self-pay | Admitting: Family Medicine

## 2019-01-26 MED ORDER — METFORMIN HCL 1000 MG PO TABS: 1000.0000 mg | ORAL_TABLET | Freq: Every day | ORAL | 1 refills | Status: DC

## 2019-01-26 MED ORDER — ATORVASTATIN CALCIUM 20 MG PO TABS: 20.0000 mg | ORAL_TABLET | Freq: Every day | ORAL | 3 refills | Status: DC

## 2019-01-26 NOTE — Telephone Encounter (Signed)
pls advise atorvastatin and regular metformin have been prescribed, call back if cost still an issue asap

## 2019-01-26 NOTE — Progress Notes (Signed)
Metformin 1000  

## 2019-01-26 NOTE — Telephone Encounter (Signed)
Said the crestor and the metformin ER 1000 is way too expensive. Said he had not been taking the other cholesterol med and the metformin 500 as directed but if you change them back he will start because they were much much cheaper. Please advise if these can be changed back

## 2019-01-27 NOTE — Telephone Encounter (Signed)
Pt aware.

## 2019-02-25 ENCOUNTER — Encounter: Payer: Self-pay | Admitting: Family Medicine

## 2019-02-26 ENCOUNTER — Other Ambulatory Visit: Payer: Self-pay

## 2019-02-26 ENCOUNTER — Telehealth: Payer: Self-pay | Admitting: *Deleted

## 2019-02-26 DIAGNOSIS — Z20822 Contact with and (suspected) exposure to covid-19: Secondary | ICD-10-CM

## 2019-02-26 MED ORDER — TRIAMTERENE-HCTZ 37.5-25 MG PO TABS: 1.0000 | ORAL_TABLET | Freq: Every day | ORAL | 0 refills | Status: DC

## 2019-02-26 NOTE — Telephone Encounter (Signed)
Pt wanted to know if his bp meds had changed because he wasn't sure and wanted clarification. Would like a call back.

## 2019-02-26 NOTE — Telephone Encounter (Signed)
Refilled patients second bp med and sent to pharmacy of choice.

## 2019-03-01 LAB — NOVEL CORONAVIRUS, NAA: SARS-CoV-2, NAA: NOT DETECTED

## 2019-04-02 DIAGNOSIS — E119 Type 2 diabetes mellitus without complications: Secondary | ICD-10-CM | POA: Diagnosis not present

## 2019-04-02 DIAGNOSIS — E785 Hyperlipidemia, unspecified: Secondary | ICD-10-CM | POA: Diagnosis not present

## 2019-04-02 DIAGNOSIS — I10 Essential (primary) hypertension: Secondary | ICD-10-CM | POA: Diagnosis not present

## 2019-04-03 ENCOUNTER — Encounter: Payer: Self-pay | Admitting: Family Medicine

## 2019-04-03 LAB — LIPID PANEL
Cholesterol: 149 mg/dL (ref <200)
HDL: 38 mg/dL — ABNORMAL LOW (ref >=40)
LDL Cholesterol (Calc): 90 mg/dL (calc)
Non-HDL Cholesterol (Calc): 111 mg/dL (calc) (ref <130)
Total CHOL/HDL Ratio: 3.9 (calc) (ref <5.0)
Triglycerides: 109 mg/dL (ref <150)

## 2019-04-03 LAB — COMPLETE METABOLIC PANEL WITH GFR
AG Ratio: 1.7 (calc) (ref 1.0–2.5)
ALT: 18 U/L (ref 9–46)
AST: 21 U/L (ref 10–35)
Albumin: 4.4 g/dL (ref 3.6–5.1)
Alkaline phosphatase (APISO): 67 U/L (ref 35–144)
BUN: 15 mg/dL (ref 7–25)
CO2: 25 mmol/L (ref 20–32)
Calcium: 9.7 mg/dL (ref 8.6–10.3)
Chloride: 103 mmol/L (ref 98–110)
Creat: 1.09 mg/dL (ref 0.70–1.33)
GFR, Est African American: 87 mL/min/{1.73_m2} (ref >=60)
GFR, Est Non African American: 75 mL/min/{1.73_m2} (ref >=60)
Globulin: 2.6 g/dL (calc) (ref 1.9–3.7)
Glucose, Bld: 143 mg/dL — ABNORMAL HIGH (ref 65–99)
Potassium: 3.9 mmol/L (ref 3.5–5.3)
Sodium: 139 mmol/L (ref 135–146)
Total Bilirubin: 0.4 mg/dL (ref 0.2–1.2)
Total Protein: 7 g/dL (ref 6.1–8.1)

## 2019-04-03 LAB — HEMOGLOBIN A1C
Hgb A1c MFr Bld: 7.4 % of total Hgb — ABNORMAL HIGH (ref <5.7)
Mean Plasma Glucose: 166 (calc)
eAG (mmol/L): 9.2 (calc)

## 2019-04-07 ENCOUNTER — Encounter: Payer: Self-pay | Admitting: Family Medicine

## 2019-05-13 ENCOUNTER — Other Ambulatory Visit: Payer: Self-pay | Admitting: Family Medicine

## 2019-05-17 MED ORDER — TRIAMTERENE-HCTZ 37.5-25 MG PO TABS: 1.0000 | ORAL_TABLET | Freq: Every day | ORAL | 0 refills | Status: DC

## 2019-05-20 ENCOUNTER — Other Ambulatory Visit: Payer: Self-pay

## 2019-05-20 ENCOUNTER — Ambulatory Visit: Payer: BLUE CROSS/BLUE SHIELD | Admitting: Family Medicine

## 2019-05-20 ENCOUNTER — Encounter: Payer: Self-pay | Admitting: Family Medicine

## 2019-05-20 VITALS — BP 118/84 | Temp 106.0°F | Resp 15 | Ht 69.0 in | Wt 266.0 lb

## 2019-05-20 DIAGNOSIS — E785 Hyperlipidemia, unspecified: Secondary | ICD-10-CM

## 2019-05-20 DIAGNOSIS — E119 Type 2 diabetes mellitus without complications: Secondary | ICD-10-CM

## 2019-05-20 DIAGNOSIS — Z23 Encounter for immunization: Secondary | ICD-10-CM | POA: Diagnosis not present

## 2019-05-20 DIAGNOSIS — Z0000 Encounter for general adult medical examination without abnormal findings: Secondary | ICD-10-CM

## 2019-05-20 DIAGNOSIS — I10 Essential (primary) hypertension: Secondary | ICD-10-CM | POA: Diagnosis not present

## 2019-05-20 DIAGNOSIS — B351 Tinea unguium: Secondary | ICD-10-CM

## 2019-05-20 DIAGNOSIS — D126 Benign neoplasm of colon, unspecified: Secondary | ICD-10-CM

## 2019-05-20 MED ORDER — BENAZEPRIL HCL 20 MG PO TABS: 20.0000 mg | ORAL_TABLET | Freq: Every day | ORAL | 3 refills | Status: DC

## 2019-05-20 MED ORDER — SILDENAFIL CITRATE 100 MG PO TABS: 50.0000 mg | ORAL_TABLET | Freq: Every day | ORAL | 11 refills | Status: DC | PRN

## 2019-05-20 MED ORDER — TERBINAFINE HCL 250 MG PO TABS: 250.0000 mg | ORAL_TABLET | Freq: Every day | ORAL | 1 refills | Status: DC

## 2019-05-20 MED ORDER — METFORMIN HCL 500 MG PO TABS: 500.0000 mg | ORAL_TABLET | Freq: Every day | ORAL | 3 refills | Status: DC

## 2019-05-20 NOTE — Patient Instructions (Signed)
F/U in office with MD in 6 months, call if you need me before  New medication Viagra is prescribed  Need to provide pt with number for Dr Fields office for him to call for his colonoscopy  New for fungal toenail, left great toe is terbinafine   Please work on lifestyle change with regular exercise and change in food choice Fasting hBA1C, cmp and EGFr, lipid panel last week in June  It is important that you exercise regularly at least 30 minutes 5 times a week. If you develop chest pain, have severe difficulty breathing, or feel very tired, stop exercising immediately and seek medical attention  \   Think about what you will eat, plan ahead. Choose " clean, green, fresh or frozen" over canned, processed or packaged foods which are more sugary, salty and fatty. 70 to 75% of food eaten should be vegetables and fruit. Three meals at set times with snacks allowed between meals, but they must be fruit or vegetables. Aim to eat over a 12 hour period , example 7 am to 7 pm, and STOP after  your last meal of the day. Drink water,generally about 64 ounces per day, no other drink is as healthy. Fruit juice is best enjoyed in a healthy way, by EATING the fruit.     

## 2019-05-20 NOTE — Progress Notes (Signed)
   Roger Prince     MRN: [id number]      DOB: December 31, 1960   HPI: Patient is in for annual physical exam. No other health concerns are expressed or addressed at the visit. Recent labs, if available are reviewed. Immunization is reviewed , and  updated .pt reminded of his need to get repeat colonoscopy which is now overdue, he had multiple polyps    PE; BP 118/84   Temp (!) 106 F (41.1 C)   Resp 15   Ht 5\' 9"  (1.753 m)   Wt 266 lb (120.7 kg)   SpO2 98%   BMI 39.28 kg/m   Pleasant male, alert and oriented x 3, in no cardio-pulmonary distress. Afebrile. HEENT No facial trauma or asymetry. Sinuses non tender. EOMI External ears normal,  Neck: supple, no adenopathy,JVD or thyromegaly.No bruits.  Chest: Clear to ascultation bilaterally.No crackles or wheezes. Non tender to palpation  Cardiovascular system; Heart sounds normal,  S1 and  S2 ,no S3.  No murmur, or thrill. Apical beat not displaced Peripheral pulses normal.  Abdomen: Soft, non tender, no organomegaly or masses. No bruits. Bowel sounds normal. No guarding, tenderness or rebound.    Musculoskeletal exam: Full ROM of spine, hips , shoulders and knees. No deformity ,swelling or crepitus noted. No muscle wasting or atrophy.   Neurologic: Cranial nerves 2 to 12 intact. Power, tone ,sensation and reflexes normal throughout. No disturbance in gait. No tremor.  Skin: Intact, no ulceration, erythema , scaling or rash noted. Fungal infection left great toenail Pigmentation normal throughout  Psych; Normal mood and affect. Judgement and concentration normal   Assessment & Plan:  Annual physical exam Annual exam as documented. Counseling done  re healthy lifestyle involving commitment to 150 minutes exercise per week, heart healthy diet, and attaining healthy weight.The importance of adequate sleep also discussed. Regular seat belt use and home safety, is also discussed. Changes in health habits are  decided on by the patient with goals and time frames  set for achieving them. Immunization and cancer screening needs are specifically addressed at this visit.   Morbid obesity  Patient re-educated about  the importance of commitment to a  minimum of 150 minutes of exercise per week as able.  The importance of healthy food choices with portion control discussed, as well as eating regularly and within a 12 hour window most days. The need to choose "clean , green" food 50 to 75% of the time is discussed, as well as to make water the primary drink and set a goal of 64 ounces water daily.    Weight /BMI 05/20/2019 11/23/2018 03/31/2017  WEIGHT 266 lb 274 lb 1.3 oz 255 lb  HEIGHT 5\' 9"  5' 9.5" 5\' 9"   BMI 39.28 kg/m2 39.89 kg/m2 37.66 kg/m2      Tubular adenoma of colon Repeat colonoscopy past due, pt to schedule appt, has multiple polyps  Onychomycosis Terbinafine is prescribed

## 2019-05-23 ENCOUNTER — Encounter: Payer: Self-pay | Admitting: Family Medicine

## 2019-05-23 NOTE — Assessment & Plan Note (Signed)
  Patient re-educated about  the importance of commitment to a  minimum of 150 minutes of exercise per week as able.  The importance of healthy food choices with portion control discussed, as well as eating regularly and within a 12 hour window most days. The need to choose "clean , green" food 50 to 75% of the time is discussed, as well as to make water the primary drink and set a goal of 64 ounces water daily.    Weight /BMI 05/20/2019 11/23/2018 03/31/2017  WEIGHT 266 lb 274 lb 1.3 oz 255 lb  HEIGHT 5\' 9"  5' 9.5" 5\' 9"   BMI 39.28 kg/m2 39.89 kg/m2 37.66 kg/m2

## 2019-05-23 NOTE — Assessment & Plan Note (Signed)
Terbinafine is prescribed 

## 2019-05-23 NOTE — Assessment & Plan Note (Signed)
Repeat colonoscopy past due, pt to schedule appt, has multiple polyps

## 2019-05-23 NOTE — Assessment & Plan Note (Signed)

## 2019-07-05 DIAGNOSIS — Z23 Encounter for immunization: Secondary | ICD-10-CM | POA: Diagnosis not present

## 2019-07-12 ENCOUNTER — Observation Stay (HOSPITAL_COMMUNITY)
Admission: EM | Admit: 2019-07-12 | Discharge: 2019-07-13 | Disposition: A | Discharge status: Home / Self Care | Payer: BLUE CROSS/BLUE SHIELD | Attending: Internal Medicine | Admitting: Internal Medicine

## 2019-07-12 ENCOUNTER — Encounter (HOSPITAL_COMMUNITY): Payer: Self-pay

## 2019-07-12 ENCOUNTER — Other Ambulatory Visit: Payer: Self-pay

## 2019-07-12 ENCOUNTER — Emergency Department (HOSPITAL_COMMUNITY): Payer: BLUE CROSS/BLUE SHIELD

## 2019-07-12 DIAGNOSIS — E871 Hypo-osmolality and hyponatremia: Secondary | ICD-10-CM | POA: Diagnosis not present

## 2019-07-12 DIAGNOSIS — R079 Chest pain, unspecified: Secondary | ICD-10-CM | POA: Diagnosis not present

## 2019-07-12 DIAGNOSIS — Z7984 Long term (current) use of oral hypoglycemic drugs: Secondary | ICD-10-CM | POA: Diagnosis not present

## 2019-07-12 DIAGNOSIS — I10 Essential (primary) hypertension: Secondary | ICD-10-CM | POA: Diagnosis present

## 2019-07-12 DIAGNOSIS — Z79899 Other long term (current) drug therapy: Secondary | ICD-10-CM | POA: Insufficient documentation

## 2019-07-12 DIAGNOSIS — R0602 Shortness of breath: Secondary | ICD-10-CM | POA: Insufficient documentation

## 2019-07-12 DIAGNOSIS — Z20822 Contact with and (suspected) exposure to covid-19: Secondary | ICD-10-CM | POA: Insufficient documentation

## 2019-07-12 DIAGNOSIS — E785 Hyperlipidemia, unspecified: Secondary | ICD-10-CM | POA: Diagnosis not present

## 2019-07-12 DIAGNOSIS — N179 Acute kidney failure, unspecified: Secondary | ICD-10-CM | POA: Diagnosis not present

## 2019-07-12 DIAGNOSIS — R0789 Other chest pain: Secondary | ICD-10-CM | POA: Diagnosis not present

## 2019-07-12 DIAGNOSIS — E119 Type 2 diabetes mellitus without complications: Secondary | ICD-10-CM | POA: Diagnosis not present

## 2019-07-12 HISTORY — DX: Chest pain, unspecified: R07.9

## 2019-07-12 HISTORY — DX: Hyperlipidemia, unspecified: E78.5

## 2019-07-12 LAB — BASIC METABOLIC PANEL
Anion gap: 11 (ref 5–15)
BUN: 23 mg/dL — ABNORMAL HIGH (ref 6–20)
CO2: 24 mmol/L (ref 22–32)
Calcium: 9.6 mg/dL (ref 8.9–10.3)
Chloride: 98 mmol/L (ref 98–111)
Creatinine, Ser: 1.5 mg/dL — ABNORMAL HIGH (ref 0.61–1.24)
GFR calc Af Amer: 59 mL/min — ABNORMAL LOW (ref >60)
GFR calc non Af Amer: 51 mL/min — ABNORMAL LOW (ref >60)
Glucose, Bld: 146 mg/dL — ABNORMAL HIGH (ref 70–99)
Potassium: 3.6 mmol/L (ref 3.5–5.1)
Sodium: 133 mmol/L — ABNORMAL LOW (ref 135–145)

## 2019-07-12 LAB — CBC
HCT: 48.5 % (ref 39.0–52.0)
Hemoglobin: 15.7 g/dL (ref 13.0–17.0)
MCH: 27.7 pg (ref 26.0–34.0)
MCHC: 32.4 g/dL (ref 30.0–36.0)
MCV: 85.5 fL (ref 80.0–100.0)
Platelets: 196 10*3/uL (ref 150–400)
RBC: 5.67 MIL/uL (ref 4.22–5.81)
RDW: 13.4 % (ref 11.5–15.5)
WBC: 9.9 10*3/uL (ref 4.0–10.5)
nRBC: 0 % (ref 0.0–0.2)

## 2019-07-12 LAB — D-DIMER, QUANTITATIVE: D-Dimer, Quant: 0.33 ug/mL-FEU (ref 0.00–0.50)

## 2019-07-12 LAB — TROPONIN I (HIGH SENSITIVITY)
Troponin I (High Sensitivity): 4 ng/L (ref <18)
Troponin I (High Sensitivity): 3 ng/L (ref <18)
Troponin I (High Sensitivity): 4 ng/L (ref <18)

## 2019-07-12 MED ORDER — FAMOTIDINE 20 MG PO TABS
20.0000 mg | ORAL_TABLET | Freq: Two times a day (BID) | ORAL | Status: DC
  Administered 2019-07-12 – 2019-07-13 (×2): 20 mg via ORAL
  Filled 2019-07-12 (×2): qty 1

## 2019-07-12 MED ORDER — ENOXAPARIN SODIUM 40 MG/0.4ML ~~LOC~~ SOLN
40.0000 mg | SUBCUTANEOUS | Status: DC
  Administered 2019-07-12: 40 mg via SUBCUTANEOUS
  Filled 2019-07-12: qty 0.4

## 2019-07-12 MED ORDER — METFORMIN HCL 500 MG PO TABS: 500.0000 mg | ORAL_TABLET | Freq: Every day | ORAL | Status: DC

## 2019-07-12 MED ORDER — SODIUM CHLORIDE 0.45 % IV SOLN: INTRAVENOUS | Status: DC

## 2019-07-12 MED ORDER — ENSURE ENLIVE PO LIQD
237.0000 mL | Freq: Two times a day (BID) | ORAL | Status: DC
  Administered 2019-07-13 (×2): 237 mL via ORAL

## 2019-07-12 MED ORDER — ATORVASTATIN CALCIUM 20 MG PO TABS
20.0000 mg | ORAL_TABLET | Freq: Every day | ORAL | Status: DC
  Administered 2019-07-12 – 2019-07-13 (×2): 20 mg via ORAL
  Filled 2019-07-12 (×2): qty 1

## 2019-07-12 MED ORDER — LIDOCAINE VISCOUS HCL 2 % MT SOLN
15.0000 mL | Freq: Once | OROMUCOSAL | Status: AC
  Administered 2019-07-12: 23:00:00 15 mL via ORAL
  Filled 2019-07-12: qty 15

## 2019-07-12 MED ORDER — ASPIRIN 325 MG PO TABS
325.0000 mg | ORAL_TABLET | Freq: Once | ORAL | Status: AC
  Administered 2019-07-12: 325 mg via ORAL
  Filled 2019-07-12: qty 1

## 2019-07-12 MED ORDER — ALUM & MAG HYDROXIDE-SIMETH 200-200-20 MG/5ML PO SUSP
30.0000 mL | Freq: Once | ORAL | Status: AC
  Administered 2019-07-12: 23:00:00 30 mL via ORAL
  Filled 2019-07-12: qty 30

## 2019-07-12 MED ORDER — BENAZEPRIL HCL 20 MG PO TABS
20.0000 mg | ORAL_TABLET | Freq: Every day | ORAL | Status: DC
  Filled 2019-07-12 (×2): qty 1

## 2019-07-12 MED ORDER — ONDANSETRON HCL 4 MG/2ML IJ SOLN: 4.0000 mg | Freq: Four times a day (QID) | INTRAMUSCULAR | Status: DC | PRN

## 2019-07-12 MED ORDER — ACETAMINOPHEN 325 MG PO TABS: 650.0000 mg | ORAL_TABLET | ORAL | Status: DC | PRN

## 2019-07-12 MED ORDER — ASPIRIN EC 81 MG PO TBEC
81.0000 mg | DELAYED_RELEASE_TABLET | Freq: Every day | ORAL | Status: DC
  Administered 2019-07-13: 10:00:00 81 mg via ORAL
  Filled 2019-07-12: qty 1

## 2019-07-12 MED ORDER — TRIAMTERENE-HCTZ 37.5-25 MG PO TABS: 1.0000 | ORAL_TABLET | Freq: Every day | ORAL | Status: DC

## 2019-07-12 NOTE — ED Triage Notes (Signed)
Pt presents to ED with mid chest pain got worse this morning. Pt states pain feels numb but at times gets sharp. Pt also c/o SOB, radiates down left arm "at times", dizziness, N/V

## 2019-07-12 NOTE — ED Provider Notes (Signed)
[facility] EMERGENCY DEPARTMENT Provider Note   CSN: [id number] Arrival date & time: 07/12/19  1526     History Chief Complaint  Patient presents with  . Chest Pain    Roger Prince is a 59 y.o. male with a past medical history significant for DM, HTN, and hyperlipidemia who presents to the ED due to intermittent episodes of central chest pain associated with shortness of breath, emesis, and diaphoresis. Patient notes he first had an episode of chest pain 2 weeks ago which has remained constant, but just a dull pain; however, Friday the pain began to intensify and become unbearable this morning. Last significant chest pain episode was this morning at 5AM that lasted roughly an hour. Patient notes when pain is intense, it radiates down his left arm. Chest pain is associated with exertion and relieved with rest and deep breaths. No history of previous chest pain. Describes chest pain as pressure, sometimes sharp in nature. No recent illness. No relationship with different positions. Denies tobacco and drug abuse. Admits to a family history of early CAD on his father's side. Chest pain also associated with left calf cramping that started this weekend.  Denies history of blood clots, recent surgeries, recent long immobilizations, and hormonal treatments. He admits current dull chest pain. He took ASA 81mg  earlier this morning.   History obtained from patient and past medical records. No interpreter used during encounter.      Past Medical History:  Diagnosis Date  . Arthritis    Lumbar degenerative disc disease  . Diabetes mellitus without complication ([place])   . Hypertension   . Neuromuscular disorder [facility])    Lumbar radiculopathy    Patient Active Problem List   Diagnosis Date Noted  . Onychomycosis 11/23/2018  . Annual physical exam 08/04/2016  . Degenerative joint disease (DJD) of lumbar spine 01/05/2016  . Seasonal allergies 06/14/2014  . Tubular adenoma of colon 04/11/2014  .  Type 2 diabetes mellitus with hemoglobin A1c goal of less than 7.0% ([place]) 01/08/2012  . Hyperlipidemia with target LDL less than 100 01/08/2012  . ED (erectile dysfunction) 05/22/2011  . Morbid obesity ([place]) 11/06/2008  . Essential hypertension 11/01/2008  . Sleep apnea 11/01/2008    Past Surgical History:  Procedure Laterality Date  . COLONOSCOPY N/A 04/08/2014   Procedure: COLONOSCOPY;  Surgeon: Danie Binder, MD;  Location: [facility] ENDO SUITE;  Service: Endoscopy;  Laterality: N/A;  10:30 AM       Family History  Problem Relation Age of Onset  . Congestive Heart Failure Father     Social History   Tobacco Use  . Smoking status: Former Smoker    Years: 2.00    Types: Cigars  . Smokeless tobacco: Never Used  Substance Use Topics  . Alcohol use: No  . Drug use: No    Home Medications Prior to Admission medications   Medication Sig Start Date End Date Taking? Authorizing Provider  aspirin EC 81 MG tablet Take 1 tablet (81 mg total) by mouth daily. 03/06/18  Yes Fayrene Helper, MD  atorvastatin (LIPITOR) 20 MG tablet Take 1 tablet (20 mg total) by mouth daily. 01/26/19  Yes Fayrene Helper, MD  benazepril (LOTENSIN) 20 MG tablet Take 1 tablet (20 mg total) by mouth daily. 05/20/19  Yes Fayrene Helper, MD  metFORMIN (GLUCOPHAGE) 500 MG tablet Take 1 tablet (500 mg total) by mouth daily with breakfast. 05/20/19  Yes Fayrene Helper, MD  sildenafil (VIAGRA) 100 MG tablet  Take 0.5-1 tablets (50-100 mg total) by mouth daily as needed for erectile dysfunction. 05/20/19  Yes Fayrene Helper, MD  triamterene-hydrochlorothiazide (MAXZIDE-25) 37.5-25 MG tablet Take 1 tablet by mouth daily. 05/17/19  Yes Fayrene Helper, MD    Allergies    Meperidine hcl  Review of Systems   Review of Systems  Constitutional: Positive for diaphoresis. Negative for chills and fever.  Respiratory: Positive for shortness of breath. Negative for cough.   Cardiovascular: Positive for  chest pain. Negative for leg swelling.  Gastrointestinal: Negative for abdominal pain, diarrhea, nausea and vomiting.  Musculoskeletal: Negative for back pain.  Neurological: Positive for dizziness and weakness.  All other systems reviewed and are negative.   Physical Exam Updated Vital Signs BP 127/79 (BP Location: Left Arm)   Pulse 100   Temp 97.7 F (36.5 C) (Oral)   Resp 14   Ht 5\' 9"  (1.753 m)   Wt 115.7 kg   SpO2 94%   BMI 37.66 kg/m   Physical Exam Vitals and nursing note reviewed.  Constitutional:      General: He is not in acute distress.    Appearance: He is not toxic-appearing.  HENT:     Head: Normocephalic.  Eyes:     Pupils: Pupils are equal, round, and reactive to light.  Cardiovascular:     Rate and Rhythm: Normal rate and regular rhythm.     Pulses: Normal pulses.     Heart sounds: Normal heart sounds. No murmur. No friction rub. No gallop.   Pulmonary:     Effort: Pulmonary effort is normal.     Breath sounds: Normal breath sounds.  Chest:     Comments: Mild anterior chest wall tenderness which patient notes is different than chest pain. No crepitus or deformity.  Abdominal:     General: Abdomen is flat. There is no distension.     Palpations: Abdomen is soft.     Tenderness: There is no abdominal tenderness. There is no guarding or rebound.  Musculoskeletal:     Cervical back: Neck supple.     Comments: Tenderness to palpation throughout left calf. No lower extremity edema. Negative homans sign bilaterally.   Skin:    General: Skin is warm and dry.  Neurological:     General: No focal deficit present.     Mental Status: He is alert.  Psychiatric:        Mood and Affect: Mood normal.        Behavior: Behavior normal.     ED Results / Procedures / Treatments   Labs (all labs ordered are listed, but only abnormal results are displayed) Labs Reviewed  BASIC METABOLIC PANEL - Abnormal; Notable for the following components:      Result Value     Sodium 133 (*)    Glucose, Bld 146 (*)    BUN 23 (*)    Creatinine, Ser 1.50 (*)    GFR calc non Af Amer 51 (*)    GFR calc Af Amer 59 (*)    All other components within normal limits  CBC  TROPONIN I (HIGH SENSITIVITY)    EKG EKG Interpretation  Date/Time:  Monday July 12 2019 15:32:31 EDT Ventricular Rate:  106 PR Interval:  170 QRS Duration: 76 QT Interval:  312 QTC Calculation: 414 R Axis:   83 Text Interpretation: Sinus tachycardia Cannot rule out Anterior infarct , age undetermined Abnormal ECG since last tracing no significant change Confirmed by Noemi Chapel [phone]) on  07/12/2019 3:55:38 PM   Radiology DG Chest 2 View  Result Date: 07/12/2019 CLINICAL DATA:  Chest pain EXAM: CHEST - 2 VIEW COMPARISON:  January 02, 2006 FINDINGS: Normal cardiomediastinal silhouette and pulmonary vasculature. No pneumonia, pulmonary edema, pleural effusion or pneumothorax. Mild skeletal degenerative change. No free subdiaphragmatic air. Bilateral gynecomastia. IMPRESSION: No acute cardiopulmonary or pleural disease. Electronically Signed   By: Revonda Humphrey   On: 07/12/2019 16:03    Procedures Procedures (including critical care time)  Medications Ordered in ED Medications - No data to display  ED Course  I have reviewed the triage vital signs and the nursing notes.  Pertinent labs & imaging results that were available during my care of the patient were reviewed by me and considered in my medical decision making (see chart for details).  Clinical Course as of Jul 12 1819  Mon Jul 12, 2019  1712 D-Dimer, Quant: 0.33 [CA]  1804 Spoke to Dr. Sallyanne Kuster with cardiology who notes that patient does not need immediate cardiology intervention at this time unless delta troponin abnormal.    [CA]  1807 Spoke to Dr. Linda Hedges who agrees to admit patient for further evaluation of his chest pain   [CA]    Clinical Course User Index [CA] Suzy Bouchard, PA-C   MDM  Rules/Calculators/A&P                      HEART pathway score: 65  59 year old male presents to the ED due to central chest pain associated with shortness of breath, emesis, and diaphoresis that worsened this AM. Chest pain associated with exertion. Admits to left calf pain that started over the weekend.Denies history of blood clots, recent surgeries, recent long immobilizations, and hormonal treatments. Vitals all within normal limits. Patient in no acute distress and non-toxic appearing. Physical exam reassuring. Mild tenderness throughout left calf. No lower extremity edema. Negative homans sign bilaterally. Will obtain routine labs, troponin, CXR, EKG, and d-dimer.  CBC reassuring with no leukocytosis.  BMP significant for mild hyponatremia 133 and AKI with creatinine at 1.5 and BUN at 23.  Hyperglycemia at 146 with normal anion gap.  Doubt DKA.  Initial troponin 4.  Will obtain delta troponin to rule out ACS.  Chest x-ray personally reviewed which is negative for signs of pneumonia, pneumothorax, or widened mediastinum.  EKG personally reviewed which demonstrates sinus tachycardia with no signs of acute ischemia. D-dimer normal at 0.33. Doubt PE/DVT. Discussed case with Dr. Rogene Houston who evaluated patient at bedside and agrees with assessment and plan.   Discussed case with cardiology. See note above. Discussed case with Dr. Linda Hedges who agrees to admit patient for further observation. COVID test pending.  Final Clinical Impression(s) / ED Diagnoses Final diagnoses:  None    Rx / DC Orders ED Discharge Orders    None       Suzy Bouchard, PA-C 07/12/19 1825    Fredia Sorrow, MD 07/13/19 [phone]

## 2019-07-12 NOTE — H&P (Signed)
History and Physical    Roger Prince [id number] DOB: [birth year] DOA: 07/12/2019  PCP: Roger Helper, MD (Confirm with patient/family/NH records and if not entered, this has to be entered at [facility] point of entry) Patient coming from: home  I have personally briefly reviewed patient's old medical records in [place]  Chief Complaint: central chest pain  HPI: Roger Prince is a 59 y.o. male with medical history significant of DM, HTN, and hyperlipidemia who presents to the ED due to intermittent episodes of central chest pain associated with shortness of breath, emesis, and diaphoresis. Patient notes he first had an episode of chest pain 2 weeks ago which has remained constant, but just a dull pain; however, Friday the pain began to intensify and become unbearable this morning. Last significant chest pain episode was this morning at 5AM that lasted roughly an hour. Patient notes when pain is intense, it radiates down his left arm. Chest pain is not associated with exertion. No history of previous chest pain. Describes chest pain as  sharp in nature. No recent illness. No relationship with different positions. Denies tobacco and drug abuse. Admits to a family history of early CAD on his father's side. Chest pain also associated with left calf cramping that started this weekend.  Denies history of blood clots, recent surgeries, recent long immobilizations, and hormonal treatments. He admits current dull chest pain. He took ASA 81mg  earlier this morning.  (For level 3, the HPI must include 4+ descriptors: Location, Quality, Severity, Duration, Timing, Context, modifying factors, associated signs/symptoms and/or status of 3+ chronic problems.)  (Please avoid self-populating past medical history here) (The initial 2-3 lines should be focused and good to copy and paste in the HPI section of the daily progress note).  ED Course: Hemodynamically stable.Lab unremarkable except for glucose of 146.  D-dimer nl. Toponin #1 4, #2 3. EKG w/o new acute changes. CXR NAD. TRH called to admit for observation for chest pain.  Review of Systems: As per HPI otherwise 10 point review of systems negative.  Unacceptable ROS statements: "10 systems reviewed," "Extensive" (without elaboration).  Acceptable ROS statements: "All others negative," "All others reviewed and are negative," and "All others unremarkable," with at [place] documented Can't double dip - if using for HPI can't use for ROS  Past Medical History:  Diagnosis Date  . Arthritis    Lumbar degenerative disc disease  . Diabetes mellitus without complication ([place])   . Hypertension   . Neuromuscular disorder ([place])    Lumbar radiculopathy    Past Surgical History:  Procedure Laterality Date  . COLONOSCOPY N/A 04/08/2014   Procedure: COLONOSCOPY;  Surgeon: Roger Binder, MD;  Location: [facility] SUITE;  Service: Endoscopy;  Laterality: N/A;  10:30 AM   Soc Hx - [employer] veteran with 10 years of service. Married 20 yrs. Has two grown daughters, 2 sons 48, 25 and an adopted son 5. 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    reports that he has quit smoking. His smoking use included cigars. He quit after 2.00 years of use. He has never used smokeless tobacco. He reports that he does not drink alcohol or use drugs.  Allergies  Allergen Reactions  . Meperidine Hcl Other (See Comments)    Burning    Family History  Problem Relation Age of Onset  . Congestive Heart Failure Father    Unacceptable: Noncontributory, unremarkable, or negative. Acceptable: Family history reviewed and not pertinent (  If you reviewed it)  Prior to Admission medications   Medication Sig Start Date End Date Taking? Authorizing Provider  aspirin EC 81 MG tablet Take 1 tablet (81 mg total) by mouth daily. 03/06/18  Yes Roger Helper, MD  atorvastatin (LIPITOR) 20 MG tablet Take 1 tablet (20 mg total) by mouth daily. 01/26/19  Yes Roger Helper, MD  benazepril (LOTENSIN) 20 MG tablet Take 1 tablet (20 mg total) by mouth daily. 05/20/19  Yes Roger Helper, MD  metFORMIN (GLUCOPHAGE) 500 MG tablet Take 1 tablet (500 mg total) by mouth daily with breakfast. 05/20/19  Yes Roger Helper, MD  sildenafil (VIAGRA) 100 MG tablet Take 0.5-1 tablets (50-100 mg total) by mouth daily as needed for erectile dysfunction. 05/20/19  Yes Roger Helper, MD  triamterene-hydrochlorothiazide (MAXZIDE-25) 37.5-25 MG tablet Take 1 tablet by mouth daily. 05/17/19  Yes Roger Helper, MD    Physical Exam: Vitals:   07/12/19 1900 07/12/19 1930 07/12/19 1936 07/12/19 2000  BP: 116/87 (!) 126/91  (!) 132/97  Pulse: 83 80  78  Resp: 13 13  (!) 21  Temp:   97.7 F (36.5 C)   TempSrc:      SpO2: 96% 98%  95%  Weight:      Height:        Constitutional: NAD, calm, comfortable Vitals:   07/12/19 1900 07/12/19 1930 07/12/19 1936 07/12/19 2000  BP: 116/87 (!) 126/91  (!) 132/97  Pulse: 83 80  78  Resp: 13 13  (!) 21  Temp:   97.7 F (36.5 C)   TempSrc:      SpO2: 96% 98%  95%  Weight:      Height:       General: large framed man in no distress  Eyes: PERRL, lids and conjunctivae normal ENMT: Mucous membranes are moist. Posterior pharynx clear of any exudate or lesions.Poor dentition with several missing teeth  Neck: normal, supple, no masses, no thyromegaly Respiratory: clear to auscultation bilaterally, no wheezing, no crackles. Normal respiratory effort. No accessory muscle use. Chest - no chest wall tenderness  Cardiovascular: Regular rate and rhythm, no murmurs / rubs / gallops. No extremity edema. 2+ pedal pulses. No carotid bruits.  Abdomen: no tenderness, no masses palpated. No hepatosplenomegaly. Bowel sounds positive.  Musculoskeletal: no clubbing / cyanosis. No joint deformity upper and lower extremities. Good ROM, no contractures. Normal muscle tone.  Skin: no rashes, lesions, ulcers. No  induration Neurologic: CN 2-12 grossly intact. Sensation intact, DTR normal. Strength 5/5 in all 4.  Psychiatric: Normal judgment and insight. Alert and oriented x 3. Normal mood.   (Anything < 9 systems with 2 bullets each down codes to level 1) (If patient refuses exam can't bill higher level) (Make sure to document decubitus ulcers present on admission -- if possible -- and whether patient has chronic indwelling catheter at time of admission)  Labs on Admission: I have personally reviewed following labs and imaging studies  CBC: Recent Labs  Lab 07/12/19 1559  WBC 9.9  HGB 15.7  HCT 48.5  MCV 85.5  PLT 123456   Basic Metabolic Panel: Recent Labs  Lab 07/12/19 1559  NA 133*  K 3.6  CL 98  CO2 24  GLUCOSE 146*  BUN 23*  CREATININE 1.50*  CALCIUM 9.6   GFR: Estimated Creatinine Clearance: 68.2 mL/min (A) (by C-G formula based on SCr of 1.5 mg/dL (H)). Liver Function Tests: No results for input(s): AST, ALT, ALKPHOS, BILITOT,  PROT, ALBUMIN in the last 168 hours. No results for input(s): LIPASE, AMYLASE in the last 168 hours. No results for input(s): AMMONIA in the last 168 hours. Coagulation Profile: No results for input(s): INR, PROTIME in the last 168 hours. Cardiac Enzymes: No results for input(s): CKTOTAL, CKMB, CKMBINDEX, TROPONINI in the last 168 hours. BNP (last 3 results) No results for input(s): PROBNP in the last 8760 hours. HbA1C: No results for input(s): HGBA1C in the last 72 hours. CBG: No results for input(s): GLUCAP in the last 168 hours. Lipid Profile: No results for input(s): CHOL, HDL, LDLCALC, TRIG, CHOLHDL, LDLDIRECT in the last 72 hours. Thyroid Function Tests: No results for input(s): TSH, T4TOTAL, FREET4, T3FREE, THYROIDAB in the last 72 hours. Anemia Panel: No results for input(s): VITAMINB12, FOLATE, FERRITIN, TIBC, IRON, RETICCTPCT in the last 72 hours. Urine analysis:    Component Value Date/Time   COLORURINE YELLOW 04/29/2013 2224    APPEARANCEUR CLEAR 04/29/2013 2224   LABSPEC >1.030 (H) 04/29/2013 2224   PHURINE 5.5 04/29/2013 2224   GLUCOSEU NEGATIVE 04/29/2013 2224   HGBUR SMALL (A) 04/29/2013 2224   BILIRUBINUR NEGATIVE 04/29/2013 2224   KETONESUR TRACE (A) 04/29/2013 2224   PROTEINUR NEGATIVE 04/29/2013 2224   UROBILINOGEN 0.2 04/29/2013 2224   NITRITE NEGATIVE 04/29/2013 2224   LEUKOCYTESUR NEGATIVE 04/29/2013 2224    Radiological Exams on Admission: DG Chest 2 View  Result Date: 07/12/2019 CLINICAL DATA:  Chest pain EXAM: CHEST - 2 VIEW COMPARISON:  January 02, 2006 FINDINGS: Normal cardiomediastinal silhouette and pulmonary vasculature. No pneumonia, pulmonary edema, pleural effusion or pneumothorax. Mild skeletal degenerative change. No free subdiaphragmatic air. Bilateral gynecomastia. IMPRESSION: No acute cardiopulmonary or pleural disease. Electronically Signed   By: Revonda Humphrey   On: 07/12/2019 16:03    EKG: Independently reviewed. Sinus tach with `1-61mm ST deprression in III - unchanged from previous study  Assessment/Plan Active Problems:   Chest pain   Essential hypertension   Type 2 diabetes mellitus with hemoglobin A1c goal of less than 7.0% (HCC)   Chest pain in adult  (please populate well all problems here in Problem List. (For example, if patient is on BP meds at home and you resume or decide to hold them, it is a problem that needs to be her. Same for CAD, COPD, HLD and so on)   1. Chest pain - high risk patient with DM, HTN, HLD, obesity but with atypical chest pain, non-exertional. Initial set troponins negative. EKG unremarkable. Plan obs-tele admit  Second set of Troponins  EKG in AM  Counseled patient re: risk reduction including weight loss and exercise.  Cardiology consult in AM Re: best dx test, I.e. nuc stress vs other  2. HTN- continue home meds  3. DM - last A1C 7.4% 04/02/19. Plan Continue home meds  4. HLD - continue statin  DVT prophylaxis: lovenox  (Lovenox/Heparin/SCD's/anticoagulated/None (if comfort care) Code Status: full code (Full/Partial (specify details) Family Communication: spoke with wife re: Dx and Tx plan (Specify name, relationship. Do not write "discussed with patient". Specify tel # if discussed over the phone) Disposition Plan: home 24-48 hrs (specify when and where you expect patient to be discharged) Consults called: cardiology -please call in AM (with names) Admission status: obs-tele (inpatient / obs / tele / medical floor / SDU)   Adella Hare MD [facility] Pager [phone]  If 7PM-7AM, please contact night-coverage [email or web address] Password [facility]  07/12/2019, 8:30 PM

## 2019-07-12 NOTE — ED Provider Notes (Signed)
Medical screening examination/treatment/procedure(s) were conducted as a shared visit with non-physician practitioner(s) and myself.  I personally evaluated the patient during the encounter.  EKG Interpretation  Date/Time:  Monday July 12 2019 15:32:31 EDT Ventricular Rate:  106 PR Interval:  170 QRS Duration: 76 QT Interval:  312 QTC Calculation: 414 R Axis:   83 Text Interpretation: Sinus tachycardia Cannot rule out Anterior infarct , age undetermined Abnormal ECG since last tracing no significant change Confirmed by Noemi Chapel [phone]) on 07/12/2019 3:55:38 PM   Patient seen by me along with physician assistant.  Patient states that he has been having substernal chest discomfort for about a week to a week and a half.  This morning though had a very intense.  Starting at 5 in the morning lasting till 6 in the morning.  Diaphoresis nausea vomiting shortness of breath and the pain became very severe in the substernal area.  It was nonradiating.  Over the past few days patient is occasionally had more intense pain.  The most recent one was this morning.  He did take a baby aspirin for this.  Patient still has dull pain in the substernal area.  Initial troponin was just 4.  EKG without any significant abnormalities.  Is a little tachycardic.  Patient's D-dimer is normal.  Not concerned about pulmonary embolus. This x-ray without any acute findings.  Physician assistant discussed with with on-call cardiology at [facility].  Felt that patient did not require admission to the cardiology service at [facility].  Did not require any acute cardiology intervention at this time.  Contacted hospitalist here for chest pain rule out with the persistent chest pain.  Cardiology is available here in the morning for consultation.  Hospitalist service will see for admission.   Results for orders placed or performed during the hospital encounter of 123456  Basic metabolic panel  Result Value Ref Range   Sodium 133  (L) 135 - 145 mmol/L   Potassium 3.6 3.5 - 5.1 mmol/L   Chloride 98 98 - 111 mmol/L   CO2 24 22 - 32 mmol/L   Glucose, Bld 146 (H) 70 - 99 mg/dL   BUN 23 (H) 6 - 20 mg/dL   Creatinine, Ser 1.50 (H) 0.61 - 1.24 mg/dL   Calcium 9.6 8.9 - 10.3 mg/dL   GFR calc non Af Amer 51 (L) >60 mL/min   GFR calc Af Amer 59 (L) >60 mL/min   Anion gap 11 5 - 15  CBC  Result Value Ref Range   WBC 9.9 4.0 - 10.5 K/uL   RBC 5.67 4.22 - 5.81 MIL/uL   Hemoglobin 15.7 13.0 - 17.0 g/dL   HCT 48.5 39.0 - 52.0 %   MCV 85.5 80.0 - 100.0 fL   MCH 27.7 26.0 - 34.0 pg   MCHC 32.4 30.0 - 36.0 g/dL   RDW 13.4 11.5 - 15.5 %   Platelets 196 150 - 400 K/uL   nRBC 0.0 0.0 - 0.2 %  D-dimer, quantitative (not at [facility])  Result Value Ref Range   D-Dimer, Quant 0.33 0.00 - 0.50 ug/mL-FEU  Troponin I (High Sensitivity)  Result Value Ref Range   Troponin I (High Sensitivity) 4 <18 ng/L   DG Chest 2 View  Result Date: 07/12/2019 CLINICAL DATA:  Chest pain EXAM: CHEST - 2 VIEW COMPARISON:  January 02, 2006 FINDINGS: Normal cardiomediastinal silhouette and pulmonary vasculature. No pneumonia, pulmonary edema, pleural effusion or pneumothorax. Mild skeletal degenerative change. No free subdiaphragmatic air. Bilateral gynecomastia. IMPRESSION:  No acute cardiopulmonary or pleural disease. Electronically Signed   By: Revonda Humphrey   On: 07/12/2019 16:03      Fredia Sorrow, MD 07/12/19 [phone]

## 2019-07-13 ENCOUNTER — Encounter (HOSPITAL_COMMUNITY): Payer: Self-pay | Admitting: Internal Medicine

## 2019-07-13 ENCOUNTER — Observation Stay (HOSPITAL_BASED_OUTPATIENT_CLINIC_OR_DEPARTMENT_OTHER): Payer: BLUE CROSS/BLUE SHIELD

## 2019-07-13 DIAGNOSIS — E785 Hyperlipidemia, unspecified: Secondary | ICD-10-CM | POA: Diagnosis not present

## 2019-07-13 DIAGNOSIS — N179 Acute kidney failure, unspecified: Secondary | ICD-10-CM

## 2019-07-13 DIAGNOSIS — E119 Type 2 diabetes mellitus without complications: Secondary | ICD-10-CM | POA: Diagnosis not present

## 2019-07-13 DIAGNOSIS — I10 Essential (primary) hypertension: Secondary | ICD-10-CM | POA: Diagnosis not present

## 2019-07-13 DIAGNOSIS — R079 Chest pain, unspecified: Secondary | ICD-10-CM | POA: Diagnosis not present

## 2019-07-13 DIAGNOSIS — R0789 Other chest pain: Secondary | ICD-10-CM | POA: Diagnosis not present

## 2019-07-13 LAB — LIPID PANEL
Cholesterol: 129 mg/dL (ref 0–200)
HDL: 30 mg/dL — ABNORMAL LOW (ref >40)
LDL Cholesterol: 64 mg/dL (ref 0–99)
Total CHOL/HDL Ratio: 4.3 RATIO
Triglycerides: 173 mg/dL — ABNORMAL HIGH (ref <150)
VLDL: 35 mg/dL (ref 0–40)

## 2019-07-13 LAB — HIV ANTIBODY (ROUTINE TESTING W REFLEX): HIV Screen 4th Generation wRfx: NONREACTIVE

## 2019-07-13 LAB — COMPREHENSIVE METABOLIC PANEL
ALT: 23 U/L (ref 0–44)
AST: 25 U/L (ref 15–41)
Albumin: 4.2 g/dL (ref 3.5–5.0)
Alkaline Phosphatase: 53 U/L (ref 38–126)
Anion gap: 9 (ref 5–15)
BUN: 25 mg/dL — ABNORMAL HIGH (ref 6–20)
CO2: 25 mmol/L (ref 22–32)
Calcium: 9.2 mg/dL (ref 8.9–10.3)
Chloride: 99 mmol/L (ref 98–111)
Creatinine, Ser: 1.53 mg/dL — ABNORMAL HIGH (ref 0.61–1.24)
GFR calc Af Amer: 58 mL/min — ABNORMAL LOW (ref >60)
GFR calc non Af Amer: 50 mL/min — ABNORMAL LOW (ref >60)
Glucose, Bld: 124 mg/dL — ABNORMAL HIGH (ref 70–99)
Potassium: 3.6 mmol/L (ref 3.5–5.1)
Sodium: 133 mmol/L — ABNORMAL LOW (ref 135–145)
Total Bilirubin: 1.4 mg/dL — ABNORMAL HIGH (ref 0.3–1.2)
Total Protein: 7.9 g/dL (ref 6.5–8.1)

## 2019-07-13 LAB — ECHOCARDIOGRAM COMPLETE
Height: 69 in
Weight: 3989.44 oz

## 2019-07-13 LAB — SARS CORONAVIRUS 2 (TAT 6-24 HRS): SARS Coronavirus 2: NEGATIVE

## 2019-07-13 LAB — HEMOGLOBIN A1C
Hgb A1c MFr Bld: 7.4 % — ABNORMAL HIGH (ref 4.8–5.6)
Mean Plasma Glucose: 165.68 mg/dL

## 2019-07-13 LAB — TROPONIN I (HIGH SENSITIVITY): Troponin I (High Sensitivity): 4 ng/L (ref <18)

## 2019-07-13 MED ORDER — PERFLUTREN LIPID MICROSPHERE
1.0000 mL | INTRAVENOUS | Status: AC | PRN
  Administered 2019-07-13: 14:00:00 2 mL via INTRAVENOUS
  Filled 2019-07-13: qty 10

## 2019-07-13 NOTE — Discharge Summary (Addendum)
Physician Discharge Roger Prince DOB: 02/23/1961 DOA: 07/12/2019  PCP: Fayrene Helper, MD  Admit date: 07/12/2019 Discharge date: 07/13/2019  Admitted From: Home Disposition:  Home   Recommendations for Outpatient Follow-up:  1. Follow up with PCP in 1-2 weeks 2. Please obtain BMP/CBC in one week   Discharge Condition: Stable CODE STATUS: FULL Diet recommendation: Heart Healthy / Carb Modified    Brief/Interim Summary: 59 year old male with a history of diabetes mellitus type 2, hypertension, hyperlipidemia presenting with substernal chest discomfort that began approximately 2 weeks ago.  He stated that it had been off and on for the past 2 weeks at rest as well as with exertion.  He stated they would last approximately 20 to 30 minutes and was dull in nature.  He had some associated shortness of breath.  He denies any recent fevers, chills, coughing, hemoptysis, or long travels.  However, he states that in the past 24 hours prior to admission his chest discomfort was more sharp in nature and lasting longer up to 1 hour.  He stated that he was walking back to the bedroom from the bathroom on the early morning of 07/12/2019 when he began experiencing the sharp substernal chest pain with some shortness of breath and nausea and vomiting.  As result, he presented for further evaluation. In the emergency department, the patient was afebrile hemodynamically stable with oxygen saturation 97% room air.  Sodium 133 with serum creatinine 1.50.  WBC 9.9, hemoglobin 15.7, platelets 196,000.  Troponins were unremarkable.  D-dimer 0.33.  EKG shows sinus rhythm with nonspecific T wave changes.  Chest x-ray was negative.  Cardiology was consulted and recommended outpt stress test when echo was reassuring  Discharge Diagnoses:  Chest pain -Troponins 4>> 3>> 4>> 4 -Personally reviewed chest x-ray--no infiltrates or edema -Personally reviewed EKG--sinus rhythm, nonspecific T wave  changes -D-dimer 0.33 -Echo--EF 60-65%, no WMA -LDL 64 -Cardiology consult-->out pt stress test  AKI -Baseline creatinine 0.9-1.1 -serum creatinine peaked 1.53 -although suspect a degree of progression of renal disease -Continue gentle IV fluids -Holding Maxide during hospitalization -Holding benazepril during hospitalization -follow up with Dr. Moshe Cipro for BMP in one week  Diabetes mellitus type 2 -04/02/2019 hemoglobin A1c 7.4 -07/13/19--hemoglobin A1c 7.4 -NovoLog sliding scale -Holding metformin--restart after d/c  Hyperlipidemia -Continue statin  Hyponatremia -Likely secondary to the patient's diuretics, volume depletion, and acute kidney injury    Discharge Instructions   Allergies as of 07/13/2019      Reactions   Meperidine Hcl Other (See Comments)   Burning      Medication List    TAKE these medications   aspirin EC 81 MG tablet Take 1 tablet (81 mg total) by mouth daily.   atorvastatin 20 MG tablet Commonly known as: LIPITOR Take 1 tablet (20 mg total) by mouth daily.   benazepril 20 MG tablet Commonly known as: LOTENSIN Take 1 tablet (20 mg total) by mouth daily.   metFORMIN 500 MG tablet Commonly known as: GLUCOPHAGE Take 1 tablet (500 mg total) by mouth daily with breakfast.   sildenafil 100 MG tablet Commonly known as: Viagra Take 0.5-1 tablets (50-100 mg total) by mouth daily as needed for erectile dysfunction.   triamterene-hydrochlorothiazide 37.5-25 MG tablet Commonly known as: MAXZIDE-25 Take 1 tablet by mouth daily.       Allergies  Allergen Reactions  . Meperidine Hcl Other (See Comments)    Burning    Consultations:  cardiology   Procedures/Studies: DG Chest 2 View  Result Date: 07/12/2019 CLINICAL DATA:  Chest pain EXAM: CHEST - 2 VIEW COMPARISON:  January 02, 2006 FINDINGS: Normal cardiomediastinal silhouette and pulmonary vasculature. No pneumonia, pulmonary edema, pleural effusion or pneumothorax. Mild  skeletal degenerative change. No free subdiaphragmatic air. Bilateral gynecomastia. IMPRESSION: No acute cardiopulmonary or pleural disease. Electronically Signed   By: Revonda Humphrey   On: 07/12/2019 16:03   ECHOCARDIOGRAM COMPLETE  Result Date: 07/13/2019    ECHOCARDIOGRAM REPORT   Patient Name:   Roger Prince Date of Exam: 07/13/2019 Medical Rec #:  [id number]   Height:       69.0 in Accession #:    [id number]  Weight:       249.3 lb Date of Birth:  31-Oct-1960    BSA:          2.269 m Patient Age:    59 years    BP:           112/63 mmHg Patient Gender: M           HR:           73 bpm. Exam Location:  [facility] Procedure: 2D Echo and Intracardiac Opacification Agent Indications:    Chest Pain 786.50 / R07.9  History:        Patient has no prior history of Echocardiogram examinations.                 Signs/Symptoms:Chest Pain; Risk Factors:Hypertension, Diabetes,                 Dyslipidemia and Former Smoker.  Sonographer:    Leavy Cella RDCS (AE) Referring Phys: [id number] [place]  1. Left ventricular ejection fraction, by estimation, is 60 to 65%. The left ventricle has normal function. The left ventricle has no regional wall motion abnormalities. There is moderate left ventricular hypertrophy of the posterior segment. Left ventricular diastolic parameters are indeterminate.  2. Right ventricular systolic function is normal. The right ventricular size is normal.  3. The mitral valve is grossly normal. No evidence of mitral valve regurgitation.  4. The aortic valve is tricuspid. Aortic valve regurgitation is not visualized. No aortic stenosis is present.  5. The inferior vena cava is normal in size with greater than 50% respiratory variability, suggesting right atrial pressure of 3 mmHg. FINDINGS  Left Ventricle: Left ventricular ejection fraction, by estimation, is 60 to 65%. The left ventricle has normal function. The left ventricle has no regional wall motion abnormalities. Definity  contrast agent was given IV to delineate the left ventricular  endocardial borders. The left ventricular internal cavity size was normal in size. There is moderate left ventricular hypertrophy of the posterior segment. Left ventricular diastolic parameters are indeterminate. Right Ventricle: The right ventricular size is normal. No increase in right ventricular wall thickness. Right ventricular systolic function is normal. Left Atrium: Left atrial size was normal in size. Right Atrium: Right atrial size was normal in size. Pericardium: There is no evidence of pericardial effusion. Mitral Valve: The mitral valve is grossly normal. Mild mitral annular calcification. No evidence of mitral valve regurgitation. Tricuspid Valve: The tricuspid valve is grossly normal. Tricuspid valve regurgitation is not demonstrated. Aortic Valve: The aortic valve is tricuspid. Aortic valve regurgitation is not visualized. No aortic stenosis is present. Pulmonic Valve: The pulmonic valve was grossly normal. Pulmonic valve regurgitation is trivial. Aorta: The aortic root is normal in size and structure. Venous: The inferior vena cava is normal in size with greater than  50% respiratory variability, suggesting right atrial pressure of 3 mmHg. IAS/Shunts: No atrial level shunt detected by color flow Doppler.  LEFT VENTRICLE PLAX 2D LVIDd:         4.43 cm  Diastology LVIDs:         3.09 cm  LV e' lateral:   7.51 cm/s LV PW:         1.46 cm  LV E/e' lateral: 8.9 LV IVS:        1.05 cm  LV e' medial:    17.80 cm/s LVOT diam:     2.10 cm  LV E/e' medial:  3.8 LVOT Area:     3.46 cm  RIGHT VENTRICLE RV S prime:     23.30 cm/s TAPSE (M-mode): 3.1 cm LEFT ATRIUM             Index       RIGHT ATRIUM           Index LA diam:        3.90 cm 1.72 cm/m  RA Area:     15.40 cm LA Vol (A2C):   44.4 ml 19.57 ml/m RA Volume:   39.30 ml  17.32 ml/m LA Vol (A4C):   40.6 ml 17.89 ml/m LA Biplane Vol: 42.4 ml 18.69 ml/m   AORTA Ao Root diam: 3.20 cm MITRAL  VALVE MV Area (PHT): 4.60 cm    SHUNTS MV Decel Time: 165 msec    Systemic Diam: 2.10 cm MV E velocity: 66.90 cm/s MV A velocity: 85.90 cm/s MV E/A ratio:  0.78 Kate Sable MD Electronically signed by Kate Sable MD Signature Date/Time: 07/13/2019/2:24:38 PM    Final          Discharge Exam: Vitals:   07/13/19 0514 07/13/19 1412  BP: 112/63 109/80  Pulse: 73 83  Resp: 18 20  Temp: 98.7 F (37.1 C) 98.2 F (36.8 C)  SpO2: 97% 94%   Vitals:   07/12/19 2219 07/13/19 0108 07/13/19 0514 07/13/19 1412  BP: 129/88 101/65 112/63 109/80  Pulse: 84 88 73 83  Resp: 18 18 18 20   Temp: 98.7 F (37.1 C) 98.5 F (36.9 C) 98.7 F (37.1 C) 98.2 F (36.8 C)  TempSrc: Oral Oral Oral Oral  SpO2: 94% 96% 97% 94%  Weight:      Height:        General: Pt is alert, awake, not in acute distress Cardiovascular: RRR, S1/S2 +, no rubs, no gallops Respiratory: CTA bilaterally, no wheezing, no rhonchi Abdominal: Soft, NT, ND, bowel sounds + Extremities: no edema, no cyanosis   The results of significant diagnostics from this hospitalization (including imaging, microbiology, ancillary and laboratory) are listed below for reference.    Significant Diagnostic Studies: DG Chest 2 View  Result Date: 07/12/2019 CLINICAL DATA:  Chest pain EXAM: CHEST - 2 VIEW COMPARISON:  January 02, 2006 FINDINGS: Normal cardiomediastinal silhouette and pulmonary vasculature. No pneumonia, pulmonary edema, pleural effusion or pneumothorax. Mild skeletal degenerative change. No free subdiaphragmatic air. Bilateral gynecomastia. IMPRESSION: No acute cardiopulmonary or pleural disease. Electronically Signed   By: Revonda Humphrey   On: 07/12/2019 16:03   ECHOCARDIOGRAM COMPLETE  Result Date: 07/13/2019    ECHOCARDIOGRAM REPORT   Patient Name:   Roger Prince Date of Exam: 07/13/2019 Medical Rec #:  [id number]   Height:       69.0 in Accession #:    [id number]  Weight:       249.3 lb Date of Birth:  [birth year]  BSA:           2.269 m Patient Age:    25 years    BP:           112/63 mmHg Patient Gender: M           HR:           73 bpm. Exam Location:  [facility] Procedure: 2D Echo and Intracardiac Opacification Agent Indications:    Chest Pain 786.50 / R07.9  History:        Patient has no prior history of Echocardiogram examinations.                 Signs/Symptoms:Chest Pain; Risk Factors:Hypertension, Diabetes,                 Dyslipidemia and Former Smoker.  Sonographer:    Leavy Cella RDCS (AE) Referring Phys: [id number] [place]  1. Left ventricular ejection fraction, by estimation, is 60 to 65%. The left ventricle has normal function. The left ventricle has no regional wall motion abnormalities. There is moderate left ventricular hypertrophy of the posterior segment. Left ventricular diastolic parameters are indeterminate.  2. Right ventricular systolic function is normal. The right ventricular size is normal.  3. The mitral valve is grossly normal. No evidence of mitral valve regurgitation.  4. The aortic valve is tricuspid. Aortic valve regurgitation is not visualized. No aortic stenosis is present.  5. The inferior vena cava is normal in size with greater than 50% respiratory variability, suggesting right atrial pressure of 3 mmHg. FINDINGS  Left Ventricle: Left ventricular ejection fraction, by estimation, is 60 to 65%. The left ventricle has normal function. The left ventricle has no regional wall motion abnormalities. Definity contrast agent was given IV to delineate the left ventricular  endocardial borders. The left ventricular internal cavity size was normal in size. There is moderate left ventricular hypertrophy of the posterior segment. Left ventricular diastolic parameters are indeterminate. Right Ventricle: The right ventricular size is normal. No increase in right ventricular wall thickness. Right ventricular systolic function is normal. Left Atrium: Left atrial size was normal in  size. Right Atrium: Right atrial size was normal in size. Pericardium: There is no evidence of pericardial effusion. Mitral Valve: The mitral valve is grossly normal. Mild mitral annular calcification. No evidence of mitral valve regurgitation. Tricuspid Valve: The tricuspid valve is grossly normal. Tricuspid valve regurgitation is not demonstrated. Aortic Valve: The aortic valve is tricuspid. Aortic valve regurgitation is not visualized. No aortic stenosis is present. Pulmonic Valve: The pulmonic valve was grossly normal. Pulmonic valve regurgitation is trivial. Aorta: The aortic root is normal in size and structure. Venous: The inferior vena cava is normal in size with greater than 50% respiratory variability, suggesting right atrial pressure of 3 mmHg. IAS/Shunts: No atrial level shunt detected by color flow Doppler.  LEFT VENTRICLE PLAX 2D LVIDd:         4.43 cm  Diastology LVIDs:         3.09 cm  LV e' lateral:   7.51 cm/s LV PW:         1.46 cm  LV E/e' lateral: 8.9 LV IVS:        1.05 cm  LV e' medial:    17.80 cm/s LVOT diam:     2.10 cm  LV E/e' medial:  3.8 LVOT Area:     3.46 cm  RIGHT VENTRICLE RV S prime:     23.30 cm/s TAPSE (  M-mode): 3.1 cm LEFT ATRIUM             Index       RIGHT ATRIUM           Index LA diam:        3.90 cm 1.72 cm/m  RA Area:     15.40 cm LA Vol (A2C):   44.4 ml 19.57 ml/m RA Volume:   39.30 ml  17.32 ml/m LA Vol (A4C):   40.6 ml 17.89 ml/m LA Biplane Vol: 42.4 ml 18.69 ml/m   AORTA Ao Root diam: 3.20 cm MITRAL VALVE MV Area (PHT): 4.60 cm    SHUNTS MV Decel Time: 165 msec    Systemic Diam: 2.10 cm MV E velocity: 66.90 cm/s MV A velocity: 85.90 cm/s MV E/A ratio:  0.78 Kate Sable MD Electronically signed by Kate Sable MD Signature Date/Time: 07/13/2019/2:24:38 PM    Final      Microbiology: Recent Results (from the past 240 hour(s))  SARS CORONAVIRUS 2 (Majd Tissue 6-24 HRS) Nasopharyngeal Nasopharyngeal Swab     Status: None   Collection Time: 07/12/19  7:25  PM   Specimen: Nasopharyngeal Swab  Result Value Ref Range Status   SARS Coronavirus 2 NEGATIVE NEGATIVE Final    Comment: (NOTE) SARS-CoV-2 target nucleic acids are NOT DETECTED. The SARS-CoV-2 RNA is generally detectable in upper and lower respiratory specimens during the acute phase of infection. Negative results do not preclude SARS-CoV-2 infection, do not rule out co-infections with other pathogens, and should not be used as the sole basis for treatment or other patient management decisions. Negative results must be combined with clinical observations, patient history, and epidemiological information. The expected result is Negative. Fact Sheet for Patients: SugarRoll.be Fact Sheet for Healthcare Providers: https://www.woods-mathews.com/ This test is not yet approved or cleared by the [place] FDA and  has been authorized for detection and/or diagnosis of SARS-CoV-2 by FDA under an Emergency Use Authorization (EUA). This EUA will remain  in effect (meaning this test can be used) for the duration of the COVID-19 declaration under Section 56 4(b)(1) of the Act, 21 U.S.C. section 360bbb-3(b)(1), unless the authorization is terminated or revoked sooner. Performed at [facility], [place] [address], [place], [place] [address]      Labs: Basic Metabolic Panel: Recent Labs  Lab 07/12/19 1559 07/13/19 0739  NA 133* 133*  K 3.6 3.6  CL 98 99  CO2 24 25  GLUCOSE 146* 124*  BUN 23* 25*  CREATININE 1.50* 1.53*  CALCIUM 9.6 9.2   Liver Function Tests: Recent Labs  Lab 07/13/19 0739  AST 25  ALT 23  ALKPHOS 53  BILITOT 1.4*  PROT 7.9  ALBUMIN 4.2   No results for input(s): LIPASE, AMYLASE in the last 168 hours. No results for input(s): AMMONIA in the last 168 hours. CBC: Recent Labs  Lab 07/12/19 1559  WBC 9.9  HGB 15.7  HCT 48.5  MCV 85.5  PLT 196   Cardiac Enzymes: No results for input(s): CKTOTAL, CKMB,  CKMBINDEX, TROPONINI in the last 168 hours. BNP: Invalid input(s): POCBNP CBG: No results for input(s): GLUCAP in the last 168 hours.  Time coordinating discharge:  36 minutes  Signed:  Orson Eva, DO [facility] Pager: [phone] 07/13/2019, 6:23 PM

## 2019-07-13 NOTE — Progress Notes (Signed)
*  PRELIMINARY RESULTS* Echocardiogram 2D Echocardiogram with definity has been performed.  Leavy Cella 07/13/2019, 2:15 PM

## 2019-07-13 NOTE — Consult Note (Addendum)
Cardiology Consult    Patient ID: DEMITRIOS DITTOE; [id number]; [birth year]   Admit date: 07/12/2019 Date of Consult: 07/13/2019  Primary Care Provider: Fayrene Helper, MD Primary Cardiologist: New to [facility] - Dr. Bronson Ing  Patient Profile    Roger Prince is a 59 y.o. male with past medical history of HTN, HLD,Type 2 DM and family history of CHF who is being seen today for the evaluation of chest pain at the request of Dr. Carles Collet.   History of Present Illness    Mr. Arredondo presented to [facility] ED on 07/12/2019 for evaluation of chest pain.  In talking with the patient today, he reports having intermittent episodes of chest discomfort over the past 2 to 3 weeks which can occur at rest or with activity.  Symptoms typically last for 10 minutes and spontaneously resolve. They are not associated with exertion, food consumption or positional changes. He has been walking up to 2 miles each day and reports playing basketball with his son as well and denies any symptoms with this. Yesterday morning, he developed a shooting chest discomfort along his left pectoral region with associated nausea and vomiting. He laid down to see if this would help with symptoms but they lasted for over an hour, therefore family members encouraged him to come to the ED for further evaluation. Symptoms spontaneously resolved while here and he denies any recurrent symptoms overnight or this morning.  He does have known HTN, HLD and Type II DM but denies any known history of CAD or CHF. He reports smoking occasional cigarettes as a teenager but no persistent use. He denies any alcohol use. He does report a strong family history of CHF as his father died from complications of CHF at the age of 45. He currently works as the Sales promotion account executive for [employer].    Initial labs show WBC 9.9, Hgb 15.7, platelets 196, Na+ 133, K+ 3.6 and creatinine 1.50 (baseline 1.0 - 1.1). Initial and repeat HS Troponin values have been negative at 4, 3, 4, and  4. COVID pending. Hgb A1c pending. FLP shows total cholesterol of 129, Triglycerides 173, HDL 30 and LDL 64. CXR with no acute cardiopulmonary findings. EKG shows NSR, HR 77 with inferior infarct pattern which is similar to prior imaging from 2017.  He was continued on ASA and Atorvastatin 20mg  daily with PTA Benazepril and Triamterene-HCTZ being held given his AKI. He has been started on IVF at 50 mL/hr.    Past Medical History:  Diagnosis Date  . Arthritis    Lumbar degenerative disc disease  . Diabetes mellitus without complication ([place])   . Hyperlipidemia   . Hypertension   . Neuromuscular disorder ([place])    Lumbar radiculopathy    Past Surgical History:  Procedure Laterality Date  . COLONOSCOPY N/A 04/08/2014   Procedure: COLONOSCOPY;  Surgeon: Danie Binder, MD;  Location: [facility];  Service: Endoscopy;  Laterality: N/A;  10:30 AM     Home Medications:  Prior to Admission medications   Medication Sig Start Date End Date Taking? Authorizing Provider  aspirin EC 81 MG tablet Take 1 tablet (81 mg total) by mouth daily. 03/06/18  Yes Fayrene Helper, MD  atorvastatin (LIPITOR) 20 MG tablet Take 1 tablet (20 mg total) by mouth daily. 01/26/19  Yes Fayrene Helper, MD  benazepril (LOTENSIN) 20 MG tablet Take 1 tablet (20 mg total) by mouth daily. 05/20/19  Yes Fayrene Helper, MD  metFORMIN (GLUCOPHAGE) 500  MG tablet Take 1 tablet (500 mg total) by mouth daily with breakfast. 05/20/19  Yes Fayrene Helper, MD  sildenafil (VIAGRA) 100 MG tablet Take 0.5-1 tablets (50-100 mg total) by mouth daily as needed for erectile dysfunction. 05/20/19  Yes Fayrene Helper, MD  triamterene-hydrochlorothiazide (MAXZIDE-25) 37.5-25 MG tablet Take 1 tablet by mouth daily. 05/17/19  Yes Fayrene Helper, MD    Inpatient Medications: Scheduled Meds: . aspirin EC  81 mg Oral Daily  . atorvastatin  20 mg Oral Daily  . enoxaparin (LOVENOX) injection  40 mg Subcutaneous Q24H  .  famotidine  20 mg Oral BID  . feeding supplement (ENSURE ENLIVE)  237 mL Oral BID BM   Continuous Infusions: . sodium chloride 50 mL/hr at 07/12/19 2249   PRN Meds: acetaminophen, ondansetron (ZOFRAN) IV  Allergies:    Allergies  Allergen Reactions  . Meperidine Hcl Other (See Comments)    Burning    Social History:   Social History   Socioeconomic History  . Marital status: Married    Spouse name: Not on file  . Number of children: Not on file  . Years of education: Not on file  . Highest education level: Not on file  Occupational History  . Not on file  Tobacco Use  . Smoking status: Former Smoker    Years: 2.00    Types: Cigars  . Smokeless tobacco: Never Used  Substance and Sexual Activity  . Alcohol use: No  . Drug use: No  . Sexual activity: Not on file  Other Topics Concern  . Not on file  Social History Narrative  . Not on file   Social Determinants of Health   Financial Resource Strain:   . Difficulty of Paying Living Expenses:   Food Insecurity:   . Worried About Charity fundraiser in the Last Year:   . Arboriculturist in the Last Year:   Transportation Needs:   . Film/video editor (Medical):   Marland Kitchen Lack of Transportation (Non-Medical):   Physical Activity:   . Days of Exercise per Week:   . Minutes of Exercise per Session:   Stress:   . Feeling of Stress :   Social Connections:   . Frequency of Communication with Friends and Family:   . Frequency of Social Gatherings with Friends and Family:   . Attends Religious Services:   . Active Member of Clubs or Organizations:   . Attends Archivist Meetings:   Marland Kitchen Marital Status:   Intimate Partner Violence:   . Fear of Current or Ex-Partner:   . Emotionally Abused:   Marland Kitchen Physically Abused:   . Sexually Abused:      Family History:    Family History  Problem Relation Age of Onset  . Congestive Heart Failure Father 54  . Heart failure Paternal Grandfather   . Heart failure Paternal  Uncle       Review of Systems    General:  No chills, fever, night sweats or weight changes.  Cardiovascular:  No  dyspnea on exertion, edema, orthopnea, palpitations, paroxysmal nocturnal dyspnea. Positive for chest pain.  Dermatological: No rash, lesions/masses Respiratory: No cough, dyspnea Urologic: No hematuria, dysuria Abdominal:   No diarrhea, bright red blood per rectum, melena, or hematemesis. Positive for nausea and vomiting.  Neurologic:  No visual changes, wkns, changes in mental status. All other systems reviewed and are otherwise negative except as noted above.  Physical Exam/Data    Vitals:  07/12/19 2201 07/12/19 2219 07/13/19 0108 07/13/19 0514  BP:  129/88 101/65 112/63  Pulse:  84 88 73  Resp:  18 18 18   Temp:  98.7 F (37.1 C) 98.5 F (36.9 C) 98.7 F (37.1 C)  TempSrc:  Oral Oral Oral  SpO2:  94% 96% 97%  Weight: 113.1 kg     Height: 5\' 9"  (1.753 m)       Intake/Output Summary (Last 24 hours) at 07/13/2019 1030 Last data filed at 07/13/2019 0535 Gross per 24 hour  Intake 817.65 ml  Output --  Net 817.65 ml   Filed Weights   07/12/19 1532 07/12/19 2201  Weight: 115.7 kg 113.1 kg   Body mass index is 36.82 kg/m.   General: Pleasant male appearing in NAD Psych: Normal affect. Neuro: Alert and oriented X 3. Moves all extremities spontaneously. HEENT: Normal  Neck: Supple without bruits or JVD. Lungs:  Resp regular and unlabored, CTA without wheezing or rales. Heart: RRR no s3, s4, or murmurs. Abdomen: Soft, non-tender, non-distended, BS + x 4.  Extremities: No clubbing, cyanosis or lower extremity edema. DP/PT/Radials 2+ and equal bilaterally.   EKG:  The EKG was personally reviewed and demonstrates: NSR, HR 77 with inferior infarct pattern which is similar to prior imaging from 2017.  Telemetry:  Telemetry was personally reviewed and demonstrates: NSR, HR in 70's to 80's. HR into 130's at times when ambulating.    Labs/Studies      Relevant CV Studies:  None on File  Laboratory Data:  Chemistry Recent Labs  Lab 07/12/19 1559 07/13/19 0739  NA 133* 133*  K 3.6 3.6  CL 98 99  CO2 24 25  GLUCOSE 146* 124*  BUN 23* 25*  CREATININE 1.50* 1.53*  CALCIUM 9.6 9.2  GFRNONAA 51* 50*  GFRAA 59* 58*  ANIONGAP 11 9    Recent Labs  Lab 07/13/19 0739  PROT 7.9  ALBUMIN 4.2  AST 25  ALT 23  ALKPHOS 53  BILITOT 1.4*   Hematology Recent Labs  Lab 07/12/19 1559  WBC 9.9  RBC 5.67  HGB 15.7  HCT 48.5  MCV 85.5  MCH 27.7  MCHC 32.4  RDW 13.4  PLT 196   Cardiac EnzymesNo results for input(s): TROPONINI in the last 168 hours. No results for input(s): TROPIPOC in the last 168 hours.  BNPNo results for input(s): BNP, PROBNP in the last 168 hours.  DDimer  Recent Labs  Lab 07/12/19 1559  DDIMER 0.33    Radiology/Studies:  DG Chest 2 View  Result Date: 07/12/2019 CLINICAL DATA:  Chest pain EXAM: CHEST - 2 VIEW COMPARISON:  January 02, 2006 FINDINGS: Normal cardiomediastinal silhouette and pulmonary vasculature. No pneumonia, pulmonary edema, pleural effusion or pneumothorax. Mild skeletal degenerative change. No free subdiaphragmatic air. Bilateral gynecomastia. IMPRESSION: No acute cardiopulmonary or pleural disease. Electronically Signed   By: Revonda Humphrey   On: 07/12/2019 16:03     Assessment & Plan    1. Chest Pain with Mixed Features - he presents with a 2-3 week history of episodic chest pain which typically occurs at rest and lasts for 10 minutes then resolves. No reported symptoms when walking for 2 miles or playing basketball with his son. His episode the day of admission was a sharp pain which lasted for over an hour but he does report associated nausea, vomiting, and dyspnea at that time.  - he has ruled out for ACS as initial and repeat HS Troponin values have been negative at  4, 3, 4, and 4. EKG shows NSR, HR 77 with inferior infarct pattern which is similar to prior imaging from  2017. - will obtain an echocardiogram to assess LV function and wall motion. If no significant abnormalities, would plan for outpatient stress testing given his cardiac risk factors (HTN, HLD, Type 2 DM and family history of cardiac issues). He did consume breakfast this AM.  - continue ASA and statin therapy.   2. HTN - BP has been well-controlled at 101/63 - 139/97 since admission. PTA Benazepril and Triamterene-HCTZ currently held.   3. HLD - FLP this admission shows total cholesterol of 129, Triglycerides 173, HDL 30 and LDL 64. Continue Atorvastatin 20mg  daily.   4. Type 2 DM - Hgb A1c 7.4 in 03/2019. Repeat pending. Management per admitting team.   5. AKI - baseline creatinine 1.0 - 1.1. Elevated to 1.50 on admission. PTA Benazepril and Triamterene-HCTZ currently held and started on IVF for hydration.    For questions or updates, please contact [facility] Please consult [email or web address] for contact info under Cardiology/STEMI.  Signed, Erma Heritage, PA-C 07/13/2019, 10:30 AM Pager: [phone]  The patient was seen and examined, and I agree with the history, physical exam, assessment and plan as documented above, with modifications made above and as noted below. I have also personally reviewed all relevant documentation, old records, labs, and both radiographic and cardiovascular studies. I have also independently interpreted old and new ECG's.  Briefly, this is a 59 year old male with a history of hypertension, hyperlipidemia, and type 2 diabetes mellitus.  Family history is notable for his father dying of CHF at the age of 37.  Over the past 2 weeks he has been noticing episodic precordial chest pains radiating down his left arm.  They can occur both at rest and with exertion.  He played basketball with his sons recently and had no difficulties whatsoever.  He also recently went on a 2 mile walk and had no exertional symptoms.  Upon further discussion, foods  occasionally get stuck in the center of his chest.  Yesterday morning he woke and had sharp chest pain in the precordial region rating down his left arm.  Symptoms typically last several minutes.  Troponins and D-dimer are normal.  Chest x-ray is normal.  He was noted to have acute renal insufficiency as creatinine was normal 3 months ago.  He is on IV fluids.  Benazepril and triamterene-hydrochlorothiazide are on hold.  Impression reviewed ECG which demonstrates sinus rhythm with late R wave transition and old inferior infarct cannot entirely be ruled out.  Recommendations: He has both typical and atypical symptoms.  He has several cardiovascular is factors.  We will plan to obtain an echocardiogram today.  If there are no significant problems with left ventricular function and regional wall motion, will plan for an outpatient nuclear stress test.  If nuclear stress test is normal, I would recommend pursuing GI work-up as he does have intermittent dysphagia for solids.  He can follow-up with me in our [place] office as this is where he resides.   Kate Sable, MD, [facility]  07/13/2019 10:48 AM   Addendum: Echocardiogram demonstrated normal LV systolic function and regional wall motion, LVEF 6065%.  We will arrange for outpatient follow-up and an outpatient nuclear stress test.  We will sign off.

## 2019-07-14 ENCOUNTER — Telehealth: Payer: Self-pay

## 2019-07-14 DIAGNOSIS — R079 Chest pain, unspecified: Secondary | ICD-10-CM

## 2019-07-14 DIAGNOSIS — Z8249 Family history of ischemic heart disease and other diseases of the circulatory system: Secondary | ICD-10-CM

## 2019-07-14 NOTE — Telephone Encounter (Signed)
Placed order. Will forward to front office to schedule.

## 2019-07-14 NOTE — Telephone Encounter (Signed)
I spoke with patient, he prefers not to quarantine again. We will schedule lexiscan, instructions given to patient and sent to MyChart.

## 2019-07-14 NOTE — Telephone Encounter (Signed)
Transition Care Management Follow-up Telephone Call   Date discharged? 07/13/2019            How have you been since you were released from the hospital? haven't had any chest pain since leaving    Do you understand why you were in the hospital? Chest pain, sob   Do you understand the discharge instructions? yes   Where were you discharged to?  home    Items Reviewed:  Medications reviewed: yes  Allergies reviewed: yes  Dietary changes reviewed: yes, heart healthy       Referrals reviewed: yes  Functional Questionnaire:   Activities of Daily Living (ADLs):  able to do his ADL;s but has wife if needed     Any transportation issues/concerns?: no concerns   Any patient concerns? no concerns   Confirmed importance and date/time of follow-up visits scheduled 07/30/19 11:20am     Confirmed with patient if condition begins to worsen call PCP or go to the ER.  Patient was given the office number and encouraged to call back with question or concerns.  : yes

## 2019-07-14 NOTE — Telephone Encounter (Signed)
-----   Message from Desma Paganini sent at 07/14/2019 10:13 AM EDT ----- Regarding: RE: Outpatient Stress Test I need an order in for this before I'm able to schedule, are we doing exercise or lexiscan?   ----- Message ----- From: Erma Heritage, PA-C Sent: 07/13/2019   4:38 PM EDT To: Doristine Mango, RN Subject: Outpatient Stress Test                         This patient needs a nuclear stress test per Dr. Bronson Ing. He reported being very active, so would anticipate an Exercise Myoview if able to have Covid testing. If he wishes to not undergo Covid testing/quarantining for the test, can have a [employer]. Associations are chest pain and family history of CAD. He is being discharged this afternoon so would wait and call tomorrow.   Would need follow-up with [facility] in [place] in 6-8 weeks. If he does not have anything, can be with me here.  Thank you for your help!  - [place]

## 2019-07-14 NOTE — Telephone Encounter (Signed)
Attempted to reach, left message for patient to call back.

## 2019-07-14 NOTE — Telephone Encounter (Signed)
-----   Message from Erma Heritage, [place] sent at 07/13/2019  4:38 PM EDT ----- Regarding: Outpatient Stress Test This patient needs a nuclear stress test per Dr. Bronson Ing. He reported being very active, so would anticipate an Exercise Myoview if able to have Covid testing. If he wishes to not undergo Covid testing/quarantining for the test, can have a [employer]. Associations are chest pain and family history of CAD. He is being discharged this afternoon so would wait and call tomorrow.   Would need follow-up with [facility] in [place] in 6-8 weeks. If he does not have anything, can be with me here.  Thank you for your help!  - [place]

## 2019-07-16 ENCOUNTER — Telehealth: Payer: Self-pay | Admitting: Family Medicine

## 2019-07-16 NOTE — Telephone Encounter (Signed)
Hospital follow up already scheduled.

## 2019-07-28 ENCOUNTER — Encounter (HOSPITAL_COMMUNITY): Payer: Self-pay

## 2019-07-28 ENCOUNTER — Encounter (HOSPITAL_COMMUNITY)
Admission: RE | Admit: 2019-07-28 | Discharge: 2019-07-28 | Disposition: A | Discharge status: Home / Self Care | Payer: BLUE CROSS/BLUE SHIELD | Source: Ambulatory Visit | Attending: Student | Admitting: Student

## 2019-07-28 ENCOUNTER — Ambulatory Visit (HOSPITAL_COMMUNITY)
Admission: RE | Admit: 2019-07-28 | Discharge: 2019-07-28 | Disposition: A | Discharge status: Home / Self Care | Payer: BLUE CROSS/BLUE SHIELD | Source: Ambulatory Visit | Attending: Student | Admitting: Student

## 2019-07-28 ENCOUNTER — Other Ambulatory Visit: Payer: Self-pay

## 2019-07-28 DIAGNOSIS — R079 Chest pain, unspecified: Secondary | ICD-10-CM

## 2019-07-28 DIAGNOSIS — Z8249 Family history of ischemic heart disease and other diseases of the circulatory system: Secondary | ICD-10-CM | POA: Diagnosis not present

## 2019-07-28 LAB — NM MYOCAR MULTI W/SPECT W/WALL MOTION / EF
LV dias vol: 94 mL (ref 62–150)
LV sys vol: 36 mL
Peak HR: 110 {beats}/min
RATE: 0.41
Rest HR: 80 {beats}/min
SDS: 1
SRS: 4
SSS: 4
TID: 1.38

## 2019-07-28 MED ORDER — SODIUM CHLORIDE FLUSH 0.9 % IV SOLN
INTRAVENOUS | Status: AC
  Administered 2019-07-28: 10 mL via INTRAVENOUS
  Filled 2019-07-28: qty 10

## 2019-07-28 MED ORDER — REGADENOSON 0.4 MG/5ML IV SOLN
INTRAVENOUS | Status: AC
  Administered 2019-07-28: 0.4 mg via INTRAVENOUS
  Filled 2019-07-28: qty 5

## 2019-07-28 MED ORDER — TECHNETIUM TC 99M TETROFOSMIN IV KIT
10.0000 | PACK | Freq: Once | INTRAVENOUS | Status: AC | PRN
  Administered 2019-07-28: 11 via INTRAVENOUS

## 2019-07-28 MED ORDER — TECHNETIUM TC 99M TETROFOSMIN IV KIT
30.0000 | PACK | Freq: Once | INTRAVENOUS | Status: AC | PRN
  Administered 2019-07-28: 30 via INTRAVENOUS

## 2019-07-30 ENCOUNTER — Other Ambulatory Visit: Payer: Self-pay

## 2019-07-30 ENCOUNTER — Ambulatory Visit (INDEPENDENT_AMBULATORY_CARE_PROVIDER_SITE_OTHER): Payer: BLUE CROSS/BLUE SHIELD | Admitting: Family Medicine

## 2019-07-30 ENCOUNTER — Encounter: Payer: Self-pay | Admitting: Family Medicine

## 2019-07-30 VITALS — BP 120/70 | Ht 69.0 in | Wt 249.0 lb

## 2019-07-30 DIAGNOSIS — Z09 Encounter for follow-up examination after completed treatment for conditions other than malignant neoplasm: Secondary | ICD-10-CM | POA: Diagnosis not present

## 2019-07-30 DIAGNOSIS — I10 Essential (primary) hypertension: Secondary | ICD-10-CM | POA: Diagnosis not present

## 2019-07-30 DIAGNOSIS — K219 Gastro-esophageal reflux disease without esophagitis: Secondary | ICD-10-CM | POA: Diagnosis not present

## 2019-07-30 NOTE — Assessment & Plan Note (Signed)
Reviewed notes, labs, test and other findings from 24-hour observation in the hospital back in the beginning of March.  Labs ordered for refollow-up on kidney function.  Starting the Pepcid to see if this is indigestion related only.  He reports that he is already shown some improvement.  Close follow-up and strict precautions and when to return to the emergency room if he were to develop chest pain that would not subside.   Reviewed side effects, risks and benefits of medication.   Patient acknowledged agreement and understanding of the plan.

## 2019-07-30 NOTE — Patient Instructions (Addendum)
I appreciate the opportunity to provide you with care for your health and wellness. Today we discussed: Recent hospital stay  Follow up: as scheduled  Labs -within the next week to make sure kidney function improved No referrals today  Continue pepcid daily.  Please take 1 to 2 hours prior to taking any medication or eating any food.  Please continue to practice social distancing to keep you, your family, and our community safe.  If you must go out, please wear a mask and practice good handwashing.  It was a pleasure to see you and I look forward to continuing to work together on your health and well-being. Please do not hesitate to call the office if you need care or have questions about your care.  Have a wonderful day and week. With Gratitude, Cherly Beach, DNP, AGNP-BC    Food Choices for Gastroesophageal Reflux Disease, Adult When you have gastroesophageal reflux disease (GERD), the foods you eat and your eating habits are very important. Choosing the right foods can help ease your discomfort. Think about working with a nutrition specialist (dietitian) to help you make good choices. What are tips for following this plan?  Meals  Choose healthy foods that are low in fat, such as fruits, vegetables, whole grains, low-fat dairy products, and lean meat, fish, and poultry.  Eat small meals often instead of 3 large meals a day. Eat your meals slowly, and in a place where you are relaxed. Avoid bending over or lying down until 2-3 hours after eating.  Avoid eating meals 2-3 hours before bed.  Avoid drinking a lot of liquid with meals.  Cook foods using methods other than frying. Bake, grill, or broil food instead.  Avoid or limit: ? Chocolate. ? Peppermint or spearmint. ? Alcohol. ? Pepper. ? Black and decaffeinated coffee. ? Black and decaffeinated tea. ? Bubbly (carbonated) soft drinks. ? Caffeinated energy drinks and soft drinks.  Limit high-fat foods such  as: ? Fatty meat or fried foods. ? Whole milk, cream, butter, or ice cream. ? Nuts and nut butters. ? Pastries, donuts, and sweets made with butter or shortening.  Avoid foods that cause symptoms. These foods may be different for everyone. Common foods that cause symptoms include: ? Tomatoes. ? Oranges, lemons, and limes. ? Peppers. ? Spicy food. ? Onions and garlic. ? Vinegar. Lifestyle  Maintain a healthy weight. Ask your doctor what weight is healthy for you. If you need to lose weight, work with your doctor to do so safely.  Exercise for at least 30 minutes for 5 or more days each week, or as told by your doctor.  Wear loose-fitting clothes.  Do not smoke. If you need help quitting, ask your doctor.  Sleep with the head of your bed higher than your feet. Use a wedge under the mattress or blocks under the bed frame to raise the head of the bed. Summary  When you have gastroesophageal reflux disease (GERD), food and lifestyle choices are very important in easing your symptoms.  Eat small meals often instead of 3 large meals a day. Eat your meals slowly, and in a place where you are relaxed.  Limit high-fat foods such as fatty meat or fried foods.  Avoid bending over or lying down until 2-3 hours after eating.  Avoid peppermint and spearmint, caffeine, alcohol, and chocolate. This information is not intended to replace advice given to you by your health care provider. Make sure you discuss any questions you have with your  health care provider. Document Revised: 08/06/2018 Document Reviewed: 05/21/2016 Elsevier Patient Education  [place].   Gastroesophageal Reflux Disease, Adult Gastroesophageal reflux (GER) happens when acid from the stomach flows up into the tube that connects the mouth and the stomach (esophagus). Normally, food travels down the esophagus and stays in the stomach to be digested. With GER, food and stomach acid sometimes move back up into the  esophagus. You may have a disease called gastroesophageal reflux disease (GERD) if the reflux:  Happens often.  Causes frequent or very bad symptoms.  Causes problems such as damage to the esophagus. When this happens, the esophagus becomes sore and swollen (inflamed). Over time, GERD can make small holes (ulcers) in the lining of the esophagus. What are the causes? This condition is caused by a problem with the muscle between the esophagus and the stomach. When this muscle is weak or not normal, it does not close properly to keep food and acid from coming back up from the stomach. The muscle can be weak because of:  Tobacco use.  Pregnancy.  Having a certain type of hernia (hiatal hernia).  Alcohol use.  Certain foods and drinks, such as coffee, chocolate, onions, and peppermint. What increases the risk? You are more likely to develop this condition if you:  Are overweight.  Have a disease that affects your connective tissue.  Use NSAID medicines. What are the signs or symptoms? Symptoms of this condition include:  Heartburn.  Difficult or painful swallowing.  The feeling of having a lump in the throat.  A bitter taste in the mouth.  Bad breath.  Having a lot of saliva.  Having an upset or bloated stomach.  Belching.  Chest pain. Different conditions can cause chest pain. Make sure you see your doctor if you have chest pain.  Shortness of breath or noisy breathing (wheezing).  Ongoing (chronic) cough or a cough at night.  Wearing away of the surface of teeth (tooth enamel).  Weight loss. How is this treated? Treatment will depend on how bad your symptoms are. Your doctor may suggest:  Changes to your diet.  Medicine.  Surgery. Follow these instructions at home: Eating and drinking   Follow a diet as told by your doctor. You may need to avoid foods and drinks such as: ? Coffee and tea (with or without caffeine). ? Drinks that contain  alcohol. ? Energy drinks and sports drinks. ? Bubbly (carbonated) drinks or sodas. ? Chocolate and cocoa. ? Peppermint and mint flavorings. ? Garlic and onions. ? Horseradish. ? Spicy and acidic foods. These include peppers, chili powder, curry powder, vinegar, hot sauces, and BBQ sauce. ? Citrus fruit juices and citrus fruits, such as oranges, lemons, and limes. ? Tomato-based foods. These include red sauce, chili, salsa, and pizza with red sauce. ? Fried and fatty foods. These include donuts, french fries, potato chips, and high-fat dressings. ? High-fat meats. These include hot dogs, rib eye steak, sausage, ham, and bacon. ? High-fat dairy items, such as whole milk, butter, and cream cheese.  Eat small meals often. Avoid eating large meals.  Avoid drinking large amounts of liquid with your meals.  Avoid eating meals during the 2-3 hours before bedtime.  Avoid lying down right after you eat.  Do not exercise right after you eat. Lifestyle   Do not use any products that contain nicotine or tobacco. These include cigarettes, e-cigarettes, and chewing tobacco. If you need help quitting, ask your doctor.  Try to lower  your stress. If you need help doing this, ask your doctor.  If you are overweight, lose an amount of weight that is healthy for you. Ask your doctor about a safe weight loss goal. General instructions  Pay attention to any changes in your symptoms.  Take over-the-counter and prescription medicines only as told by your doctor. Do not take aspirin, ibuprofen, or other NSAIDs unless your doctor says it is okay.  Wear loose clothes. Do not wear anything tight around your waist.  Raise (elevate) the head of your bed about 6 inches (15 cm).  Avoid bending over if this makes your symptoms worse.  Keep all follow-up visits as told by your doctor. This is important. Contact a doctor if:  You have new symptoms.  You lose weight and you do not know why.  You have  trouble swallowing or it hurts to swallow.  You have wheezing or a cough that keeps happening.  Your symptoms do not get better with treatment.  You have a hoarse voice. Get help right away if:  You have pain in your arms, neck, jaw, teeth, or back.  You feel sweaty, dizzy, or light-headed.  You have chest pain or shortness of breath.  You throw up (vomit) and your throw-up looks like blood or coffee grounds.  You pass out (faint).  Your poop (stool) is bloody or black.  You cannot swallow, drink, or eat. Summary  If a person has gastroesophageal reflux disease (GERD), food and stomach acid move back up into the esophagus and cause symptoms or problems such as damage to the esophagus.  Treatment will depend on how bad your symptoms are.  Follow a diet as told by your doctor.  Take all medicines only as told by your doctor. This information is not intended to replace advice given to you by your health care provider. Make sure you discuss any questions you have with your health care provider. Document Revised: 10/22/2017 Document Reviewed: 10/22/2017 Elsevier Patient Education  [place].

## 2019-07-30 NOTE — Progress Notes (Signed)
Virtual Visit via Telephone Note   This visit type was conducted due to national recommendations for restrictions regarding the COVID-19 Pandemic (e.g. social distancing) in an effort to limit this patient's exposure and mitigate transmission in our community.  Due to his co-morbid illnesses, this patient is at least at moderate risk for complications without adequate follow up.  This format is felt to be most appropriate for this patient at this time.  The patient did not have access to video technology/had technical difficulties with video requiring transitioning to audio format only (telephone).  All issues noted in this document were discussed and addressed.  No physical exam could be performed with this format.    Evaluation Performed:  Follow-up visit  Date:  07/30/2019   ID:  Roger Prince, DOB 06-30-1960, MRN [id number]  Patient Location: Home Provider Location: Office  Location of Patient: Home Location of Provider: Telehealth Consent was obtain for visit to be over via telehealth. I verified that I am speaking with the correct person using two identifiers.  PCP:  Fayrene Helper, MD   Chief Complaint:   Transition of care 24-hour observation in the hospital.  History of Present Illness:    Roger Prince is a 59 y.o. male with history of arthritis, hyperlipidemia, hypertension among others.  Including diabetes type 2.  Reports taking all his medications without any issues or concerns.  Reports that he is feeling much better since being out of the hospital.  On July 12, 2019 he was admitted for emergency room 24-hour observation was discharged within 24 hours. He presented to the emergency room on the 15th with substernal chest discomfort that began approximately 2 weeks prior to that visit.  He stated a bit on and off for those 2 weeks prior at rest and as well as with exertion.  It would last approximately 20 to 30 minutes with a dull sensation nature.  He had some associated  shortness of breath.  He denied having any recent fevers chills coughing hemoptysis or long travels.  He did state that the last 24 hours prior to him going to the emergency room his chest discomfort had become more sharp in nature and was lasting for longer than an hour which prompted him to come to the ER.  He stated that walking back and forth in his bedroom to his bathroom in the early morning of the 15th he began experiencing this sharp substernal chest pain with some shortness of breath and nausea and vomiting.  As result he decided to present to the emergency room as stated above.  In the emergency room he was found to be afebrile and hemodynamically stable with an oxygenation of 97% on room air.  Labs were rather unremarkable.  EKG showed sinus rhythm with nonspecific ST wave changes.  Chest x-ray was negative.  Cardiology was consulted and recommended with outpatient stress test since echo was reassuring.  Echo demonstrated an EF of 65 to 60% .  Reports today that he had his outpatient stress test earlier this week and it was unremarkable.  He reports that he started taking some Pepcid over-the-counter that his wife picked up and noticed that his discomfort had improved.  He wants to know how long he needs to be taking this and wants to know if that could have been the problem the whole entire time.  He denies having any recent episodes of chest discomfort similar to that that took him to the emergency room  or the Sherlean Foot that he was having prior to going to the emergency room.  The patient does not have symptoms concerning for COVID-19 infection (fever, chills, cough, or new shortness of breath).   Past Medical, Surgical, Social History, Allergies, and Medications have been Reviewed.  Past Medical History:  Diagnosis Date  . Arthritis    Lumbar degenerative disc disease  . Diabetes mellitus without complication ([place])   . Hyperlipidemia   . Hypertension   . Neuromuscular disorder ([place])     Lumbar radiculopathy   Past Surgical History:  Procedure Laterality Date  . COLONOSCOPY N/A 04/08/2014   Procedure: COLONOSCOPY;  Surgeon: Danie Binder, MD;  Location: [facility];  Service: Endoscopy;  Laterality: N/A;  10:30 AM     Current Meds  Medication Sig  . aspirin EC 81 MG tablet Take 1 tablet (81 mg total) by mouth daily.  Marland Kitchen atorvastatin (LIPITOR) 20 MG tablet Take 1 tablet (20 mg total) by mouth daily.  . benazepril (LOTENSIN) 20 MG tablet Take 1 tablet (20 mg total) by mouth daily.  . metFORMIN (GLUCOPHAGE) 500 MG tablet Take 1 tablet (500 mg total) by mouth daily with breakfast.  . sildenafil (VIAGRA) 100 MG tablet Take 0.5-1 tablets (50-100 mg total) by mouth daily as needed for erectile dysfunction.  . triamterene-hydrochlorothiazide (MAXZIDE-25) 37.5-25 MG tablet Take 1 tablet by mouth daily.     Allergies:   Meperidine hcl   ROS:   Please see the history of present illness.    All other systems reviewed and are negative.   Labs/Other Tests and Data Reviewed:    Recent Labs: 11/24/2018: TSH 1.36 07/12/2019: Hemoglobin 15.7; Platelets 196 07/13/2019: ALT 23; BUN 25; Creatinine, Ser 1.53; Potassium 3.6; Sodium 133   Recent Lipid Panel Lab Results  Component Value Date/Time   CHOL 129 07/13/2019 07:39 AM   TRIG 173 (H) 07/13/2019 07:39 AM   HDL 30 (L) 07/13/2019 07:39 AM   CHOLHDL 4.3 07/13/2019 07:39 AM   LDLCALC 64 07/13/2019 07:39 AM   LDLCALC 90 04/02/2019 08:08 AM    Wt Readings from Last 3 Encounters:  07/30/19 249 lb (112.9 kg)  07/12/19 249 lb 5.4 oz (113.1 kg)  05/20/19 266 lb (120.7 kg)     Objective:    Vital Signs:  BP 120/70   Ht 5\' 9"  (1.753 m)   Wt 249 lb (112.9 kg)   BMI 36.77 kg/m    VITAL SIGNS:  reviewed GEN:  Alert and oriented RESPIRATORY:  No shortness of breath noted in conversation PSYCH:  Normal affect and mood  ASSESSMENT & PLAN:    1. Encounter for examination following treatment at hospital   2. Essential  hypertension  - CBC - COMPLETE METABOLIC PANEL WITH GFR  3. Gastroesophageal reflux disease, unspecified whether esophagitis present   Time:   Today, I have spent 20 minutes with the patient with telehealth technology discussing the above problems.     Medication Adjustments/Labs and Tests Ordered: Current medicines are reviewed at length with the patient today.  Concerns regarding medicines are outlined above.   Tests Ordered: Orders Placed This Encounter  Procedures  . CBC  . COMPLETE METABOLIC PANEL WITH GFR    Medication Changes: No orders of the defined types were placed in this encounter.   Disposition:  Follow up 11/18/2019 Signed, Perlie Mayo, NP  07/30/2019 10:55 AM     [facility]

## 2019-07-30 NOTE — Assessment & Plan Note (Addendum)
Advised on acid reflux and heartburn.  Encouraged to take Pepcid daily instructed on the best time to take the Pepcid 1 to 2 hours before eating or drinking or taking other medications.  Also reviewed GERD diet and lifestyle modifications.  Along with written information.  Reviewed side effects, risks and benefits of medication.   Patient acknowledged agreement and understanding of the plan.

## 2019-07-30 NOTE — Assessment & Plan Note (Signed)
Roger Prince is encouraged to maintain a well balanced diet that is low in salt. Controlled, continue current medication regimen. No refills needed.  Additionally, he is also reminded that exercise is beneficial for heart health and control of  Blood pressure. 30-60 minutes daily is recommended-walking was suggested.

## 2019-08-03 DIAGNOSIS — Z23 Encounter for immunization: Secondary | ICD-10-CM | POA: Diagnosis not present

## 2019-08-18 ENCOUNTER — Other Ambulatory Visit: Payer: Self-pay | Admitting: Family Medicine

## 2019-08-27 ENCOUNTER — Ambulatory Visit: Payer: BLUE CROSS/BLUE SHIELD | Admitting: Cardiovascular Disease

## 2019-09-02 ENCOUNTER — Ambulatory Visit: Payer: BLUE CROSS/BLUE SHIELD | Admitting: Cardiovascular Disease

## 2019-11-18 ENCOUNTER — Ambulatory Visit: Payer: BLUE CROSS/BLUE SHIELD | Admitting: Family Medicine

## 2019-11-22 ENCOUNTER — Other Ambulatory Visit: Payer: Self-pay

## 2019-11-22 ENCOUNTER — Other Ambulatory Visit: Payer: Self-pay | Admitting: *Deleted

## 2019-11-22 ENCOUNTER — Encounter: Payer: Self-pay | Admitting: Family Medicine

## 2019-11-22 ENCOUNTER — Ambulatory Visit: Payer: BLUE CROSS/BLUE SHIELD | Admitting: Family Medicine

## 2019-11-22 VITALS — BP 120/72 | HR 96 | Temp 97.8°F | Resp 16 | Ht 69.0 in | Wt 236.0 lb

## 2019-11-22 DIAGNOSIS — E669 Obesity, unspecified: Secondary | ICD-10-CM | POA: Diagnosis not present

## 2019-11-22 DIAGNOSIS — E1159 Type 2 diabetes mellitus with other circulatory complications: Secondary | ICD-10-CM

## 2019-11-22 DIAGNOSIS — I10 Essential (primary) hypertension: Secondary | ICD-10-CM | POA: Diagnosis not present

## 2019-11-22 DIAGNOSIS — E785 Hyperlipidemia, unspecified: Secondary | ICD-10-CM

## 2019-11-22 DIAGNOSIS — E119 Type 2 diabetes mellitus without complications: Secondary | ICD-10-CM | POA: Diagnosis not present

## 2019-11-22 DIAGNOSIS — Z23 Encounter for immunization: Secondary | ICD-10-CM

## 2019-11-22 DIAGNOSIS — B351 Tinea unguium: Secondary | ICD-10-CM

## 2019-11-22 DIAGNOSIS — D126 Benign neoplasm of colon, unspecified: Secondary | ICD-10-CM | POA: Diagnosis not present

## 2019-11-22 DIAGNOSIS — Z125 Encounter for screening for malignant neoplasm of prostate: Secondary | ICD-10-CM

## 2019-11-22 LAB — POCT GLYCOSYLATED HEMOGLOBIN (HGB A1C)
HbA1c POC (<> result, manual entry): 6.3 % (ref 4.0–5.6)
HbA1c, POC (controlled diabetic range): 6.3 % (ref 0.0–7.0)
HbA1c, POC (prediabetic range): 6.3 % (ref 5.7–6.4)
Hemoglobin A1C: 6.3 % — AB (ref 4.0–5.6)

## 2019-11-22 MED ORDER — TRIAMTERENE-HCTZ 37.5-25 MG PO TABS: 1.0000 | ORAL_TABLET | Freq: Every day | ORAL | 0 refills | Status: DC

## 2019-11-22 MED ORDER — ATORVASTATIN CALCIUM 20 MG PO TABS: 20.0000 mg | ORAL_TABLET | Freq: Every day | ORAL | 3 refills | Status: DC

## 2019-11-22 NOTE — Assessment & Plan Note (Signed)
Worsened, affects left great toenail only, intolerant of terbinafine

## 2019-11-22 NOTE — Assessment & Plan Note (Signed)
Had 7 polyps, coloscopy past due  Needs to follow through

## 2019-11-22 NOTE — Assessment & Plan Note (Signed)
ImProved  Patient re-educated about  the importance of commitment to a  minimum of 150 minutes of exercise per week as able.  The importance of healthy food choices with portion control discussed, as well as eating regularly and within a 12 hour window most days. The need to choose "clean , green" food 50 to 75% of the time is discussed, as well as to make water the primary drink and set a goal of 64 ounces water daily.    Weight /BMI 11/22/2019 07/30/2019 07/12/2019  WEIGHT 236 lb 249 lb 249 lb 5.4 oz  HEIGHT 5\' 9"  5\' 9"  5\' 9"   BMI 34.85 kg/m2 36.77 kg/m2 36.82 kg/m2

## 2019-11-22 NOTE — Patient Instructions (Signed)
Annual physical exam and office with MD in January when due call if you need me sooner.   Shingrix No. 2 today.  Congratulations on excellent improvement in your blood sugar with changing lifestyle and Metformin 1 tablet daily.  Please keep this up.  Please schedule an get your eyes examined.  Very important that you get your repeat colonoscopy due to multiple colon polyps please follow through and get this done.  Fasting lipid CMP and EGFR PSA and microalbumin first week in October.  The results will be sent to you via MyChart.  It is important that you exercise regularly at least 30 minutes 5 times a week. If you develop chest pain, have severe difficulty breathing, or feel very tired, stop exercising immediately and seek medical attention    Think about what you will eat, plan ahead. Choose " clean, green, fresh or frozen" over canned, processed or packaged foods which are more sugary, salty and fatty. 70 to 75% of food eaten should be vegetables and fruit. Three meals at set times with snacks allowed between meals, but they must be fruit or vegetables. Aim to eat over a 12 hour period , example 7 am to 7 pm, and STOP after  your last meal of the day. Drink water,generally about 64 ounces per day, no other drink is as healthy. Fruit juice is best enjoyed in a healthy way, by EATING the fruit. Thanks for choosing [facility], we consider it a privelige to serve you.

## 2019-11-22 NOTE — Progress Notes (Signed)
Roger Prince     MRN: [id number]      DOB: [birth year]   HPI Roger Prince is here for follow up and re-evaluation of chronic medical conditions, medication management and review of any available recent lab and radiology data.  Preventive health is updated, specifically  Cancer screening and Immunization.   Questions or concerns regarding consultations or procedures which the PT has had in the interim are  addressed. The PT denies any adverse reactions to current medications since the last visit.  There are no new concerns.  There are no specific complaints   ROS Denies recent fever or chills. Denies sinus pressure, nasal congestion, ear pain or sore throat. Denies chest congestion, productive cough or wheezing. Denies chest pains, palpitations and leg swelling Denies abdominal pain, nausea, vomiting,diarrhea or constipation.   Denies dysuria, frequency, hesitancy or incontinence. Denies joint pain, swelling and limitation in mobility. Denies headaches, seizures, numbness, or tingling. Denies depression, anxiety or insomnia. Denies skin break down or rash.   PE  BP 120/72   Pulse 96   Temp 97.8 F (36.6 C) (Oral)   Resp 16   Ht 5\' 9"  (1.753 m)   Wt (!) 236 lb (107 kg)   SpO2 93%   BMI 34.85 kg/m   Patient alert and oriented and in no cardiopulmonary distress.  HEENT: No facial asymmetry, EOMI,     Neck supple .  Chest: Clear to auscultation bilaterally.  CVS: S1, S2 no murmurs, no S3.Regular rate.  ABD: Soft non tender.   Ext: No edema  MS: Adequate ROM spine, shoulders, hips and knees.  Skin: Intact, no ulcerations or rash noted.onychomycosis left great toenail  Psych: Good eye contact, normal affect. Memory intact not anxious or depressed appearing.  CNS: CN 2-12 intact, power,  normal throughout.no focal deficits noted.   Assessment & Plan  Essential hypertension Controlled, no change in medication DASH diet and commitment to daily physical activity for a  minimum of 30 minutes discussed and encouraged, as a part of hypertension management. The importance of attaining a healthy weight is also discussed.  BP/Weight 11/22/2019 07/30/2019 07/13/2019 07/12/2019 05/20/2019 11/23/2018 62/09/9483  Systolic BP 462 703 500 - 938 182 993  Diastolic BP 72 70 80 - 84 86 80  Wt. (Lbs) 236 249 - 249.34 266 274.08 255  BMI 34.85 36.77 - 36.82 39.28 39.89 37.66       Type 2 diabetes mellitus with hemoglobin A1c goal of less than 7.0% (HCC) Marked improvement Roger Prince is reminded of the importance of commitment to daily physical activity for 30 minutes or more, as able and the need to limit carbohydrate intake to 30 to 60 grams per meal to help with blood sugar control.   The need to take medication as prescribed, test blood sugar as directed, and to call between visits if there is a concern that blood sugar is uncontrolled is also discussed.   Roger Prince is reminded of the importance of daily foot exam, annual eye examination, and good blood sugar, blood pressure and cholesterol control.  Diabetic Labs Latest Ref Rng & Units 11/22/2019 11/22/2019 11/22/2019 11/22/2019 07/13/2019  HbA1c 4.0 - 5.6 % 6.3 6.3 6.3 6.3(A) 7.4(H)  Microalbumin mg/dL - - - - -  Micro/Creat Ratio <30 mcg/mg creat - - - - -  Chol 0 - 200 mg/dL - - - - 129  HDL >40 mg/dL - - - - 30(L)  Calc LDL 0 - 99 mg/dL - - - -  64  Triglycerides <150 mg/dL - - - - 173(H)  Creatinine 0.61 - 1.24 mg/dL - - - - 1.53(H)   BP/Weight 11/22/2019 07/30/2019 07/13/2019 07/12/2019 05/20/2019 11/23/2018 16/04/958  Systolic BP 454 098 119 - 147 829 562  Diastolic BP 72 70 80 - 84 86 80  Wt. (Lbs) 236 249 - 249.34 266 274.08 255  BMI 34.85 36.77 - 36.82 39.28 39.89 37.66   Foot/eye exam completion dates 11/22/2019 11/23/2018  Foot Form Completion Done Done      Controlled, no change in medication   Tubular adenoma of colon Had 7 polyps, coloscopy past due  Needs to follow through  Obesity (BMI  30.0-34.9) ImProved  Patient re-educated about  the importance of commitment to a  minimum of 150 minutes of exercise per week as able.  The importance of healthy food choices with portion control discussed, as well as eating regularly and within a 12 hour window most days. The need to choose "clean , green" food 50 to 75% of the time is discussed, as well as to make water the primary drink and set a goal of 64 ounces water daily.    Weight /BMI 11/22/2019 07/30/2019 07/12/2019  WEIGHT 236 lb 249 lb 249 lb 5.4 oz  HEIGHT 5\' 9"  5\' 9"  5\' 9"   BMI 34.85 kg/m2 36.77 kg/m2 36.82 kg/m2      Onychomycosis Worsened, affects left great toenail only, intolerant of terbinafine

## 2019-11-22 NOTE — Assessment & Plan Note (Signed)
Marked improvement Roger Prince is reminded of the importance of commitment to daily physical activity for 30 minutes or more, as able and the need to limit carbohydrate intake to 30 to 60 grams per meal to help with blood sugar control.   The need to take medication as prescribed, test blood sugar as directed, and to call between visits if there is a concern that blood sugar is uncontrolled is also discussed.   Roger Prince is reminded of the importance of daily foot exam, annual eye examination, and good blood sugar, blood pressure and cholesterol control.  Diabetic Labs Latest Ref Rng & Units 11/22/2019 11/22/2019 11/22/2019 11/22/2019 07/13/2019  HbA1c 4.0 - 5.6 % 6.3 6.3 6.3 6.3(A) 7.4(H)  Microalbumin mg/dL - - - - -  Micro/Creat Ratio <30 mcg/mg creat - - - - -  Chol 0 - 200 mg/dL - - - - 129  HDL >40 mg/dL - - - - 30(L)  Calc LDL 0 - 99 mg/dL - - - - 64  Triglycerides <150 mg/dL - - - - 173(H)  Creatinine 0.61 - 1.24 mg/dL - - - - 1.53(H)   BP/Weight 11/22/2019 07/30/2019 07/13/2019 07/12/2019 05/20/2019 11/23/2018 62/11/6379  Systolic BP 771 165 790 - 383 338 329  Diastolic BP 72 70 80 - 84 86 80  Wt. (Lbs) 236 249 - 249.34 266 274.08 255  BMI 34.85 36.77 - 36.82 39.28 39.89 37.66   Foot/eye exam completion dates 11/22/2019 11/23/2018  Foot Form Completion Done Done      Controlled, no change in medication

## 2019-11-22 NOTE — Assessment & Plan Note (Signed)
Controlled, no change in medication DASH diet and commitment to daily physical activity for a minimum of 30 minutes discussed and encouraged, as a part of hypertension management. The importance of attaining a healthy weight is also discussed.  BP/Weight 11/22/2019 07/30/2019 07/13/2019 07/12/2019 05/20/2019 11/23/2018 40/09/8401  Systolic BP 353 317 409 - 927 800 447  Diastolic BP 72 70 80 - 84 86 80  Wt. (Lbs) 236 249 - 249.34 266 274.08 255  BMI 34.85 36.77 - 36.82 39.28 39.89 37.66

## 2019-11-23 ENCOUNTER — Encounter: Payer: Self-pay | Admitting: Internal Medicine

## 2019-12-28 ENCOUNTER — Ambulatory Visit: Payer: BLUE CROSS/BLUE SHIELD

## 2020-02-08 ENCOUNTER — Other Ambulatory Visit: Payer: Self-pay

## 2020-02-08 ENCOUNTER — Ambulatory Visit: Payer: BLUE CROSS/BLUE SHIELD

## 2020-02-08 ENCOUNTER — Encounter: Payer: Self-pay | Admitting: Internal Medicine

## 2020-02-16 ENCOUNTER — Other Ambulatory Visit: Payer: Self-pay | Admitting: Family Medicine

## 2020-02-20 ENCOUNTER — Other Ambulatory Visit: Payer: Self-pay | Admitting: Family Medicine

## 2020-02-26 ENCOUNTER — Other Ambulatory Visit: Payer: Self-pay | Admitting: Family Medicine

## 2020-04-24 ENCOUNTER — Other Ambulatory Visit: Payer: Self-pay | Admitting: Family Medicine

## 2020-05-16 ENCOUNTER — Other Ambulatory Visit: Payer: Self-pay | Admitting: Family Medicine

## 2020-05-17 ENCOUNTER — Other Ambulatory Visit: Payer: Self-pay

## 2020-05-17 ENCOUNTER — Telehealth: Payer: Self-pay

## 2020-05-17 MED ORDER — METFORMIN HCL 500 MG PO TABS: ORAL_TABLET | ORAL | 0 refills | Status: DC

## 2020-05-17 MED ORDER — BENAZEPRIL HCL 20 MG PO TABS: 20.0000 mg | ORAL_TABLET | Freq: Every day | ORAL | 0 refills | Status: DC

## 2020-05-17 MED ORDER — TRIAMTERENE-HCTZ 37.5-25 MG PO TABS: 1.0000 | ORAL_TABLET | Freq: Every day | ORAL | 0 refills | Status: DC

## 2020-05-17 NOTE — Telephone Encounter (Signed)
Patient needing refills on metformin and 2 blood pressure medications p# [phone]

## 2020-05-17 NOTE — Telephone Encounter (Signed)
Refills sent with note to schedule a follow up appt before next due

## 2020-05-22 ENCOUNTER — Encounter: Payer: BLUE CROSS/BLUE SHIELD | Admitting: Family Medicine

## 2020-05-23 ENCOUNTER — Other Ambulatory Visit: Payer: Self-pay | Admitting: Family Medicine

## 2020-06-22 ENCOUNTER — Encounter: Payer: BLUE CROSS/BLUE SHIELD | Admitting: Family Medicine

## 2020-08-11 ENCOUNTER — Other Ambulatory Visit: Payer: Self-pay | Admitting: Family Medicine

## 2020-11-14 ENCOUNTER — Other Ambulatory Visit: Payer: Self-pay | Admitting: Family Medicine

## 2020-11-15 ENCOUNTER — Other Ambulatory Visit: Payer: Self-pay | Admitting: Family Medicine

## 2021-02-19 ENCOUNTER — Other Ambulatory Visit: Payer: Self-pay | Admitting: Family Medicine

## 2021-02-26 ENCOUNTER — Other Ambulatory Visit: Payer: Self-pay | Admitting: Family Medicine

## 2021-05-21 ENCOUNTER — Other Ambulatory Visit: Payer: Self-pay | Admitting: Family Medicine

## 2021-08-24 ENCOUNTER — Other Ambulatory Visit: Payer: Self-pay | Admitting: Family Medicine

## 2021-11-28 ENCOUNTER — Other Ambulatory Visit: Payer: Self-pay | Admitting: Family Medicine

## 2022-02-24 ENCOUNTER — Other Ambulatory Visit: Payer: Self-pay | Admitting: Family Medicine

## 2022-03-15 ENCOUNTER — Encounter: Payer: BLUE CROSS/BLUE SHIELD | Admitting: Family Medicine

## 2022-03-19 ENCOUNTER — Telehealth: Payer: Self-pay | Admitting: Family Medicine

## 2022-03-19 ENCOUNTER — Other Ambulatory Visit: Payer: Self-pay

## 2022-03-19 DIAGNOSIS — I10 Essential (primary) hypertension: Secondary | ICD-10-CM

## 2022-03-19 DIAGNOSIS — Z125 Encounter for screening for malignant neoplasm of prostate: Secondary | ICD-10-CM

## 2022-03-19 DIAGNOSIS — E785 Hyperlipidemia, unspecified: Secondary | ICD-10-CM

## 2022-03-19 DIAGNOSIS — E119 Type 2 diabetes mellitus without complications: Secondary | ICD-10-CM

## 2022-03-19 NOTE — Telephone Encounter (Signed)
Pt wants to know if his labs can be ordered & sent to [facility]?  Can you please call pt when this is sent?

## 2022-03-19 NOTE — Telephone Encounter (Signed)
Patient aware labs ordered to [facility]

## 2022-03-22 LAB — COMPLETE METABOLIC PANEL WITH GFR
AG Ratio: 1.4 (calc) (ref 1.0–2.5)
ALT: 18 U/L (ref 9–46)
AST: 18 U/L (ref 10–35)
Albumin: 4.6 g/dL (ref 3.6–5.1)
Alkaline phosphatase (APISO): 65 U/L (ref 35–144)
BUN: 16 mg/dL (ref 7–25)
CO2: 25 mmol/L (ref 20–32)
Calcium: 9.8 mg/dL (ref 8.6–10.3)
Chloride: 101 mmol/L (ref 98–110)
Creat: 1.03 mg/dL (ref 0.70–1.35)
Globulin: 3.4 g/dL (calc) (ref 1.9–3.7)
Glucose, Bld: 136 mg/dL — ABNORMAL HIGH (ref 65–99)
Potassium: 4.3 mmol/L (ref 3.5–5.3)
Sodium: 136 mmol/L (ref 135–146)
Total Bilirubin: 0.4 mg/dL (ref 0.2–1.2)
Total Protein: 8 g/dL (ref 6.1–8.1)
eGFR: 83 mL/min/{1.73_m2} (ref >=60)

## 2022-03-22 LAB — CBC
HCT: 47.2 % (ref 38.5–50.0)
Hemoglobin: 15.9 g/dL (ref 13.2–17.1)
MCH: 28 pg (ref 27.0–33.0)
MCHC: 33.7 g/dL (ref 32.0–36.0)
MCV: 83.1 fL (ref 80.0–100.0)
MPV: 10.8 fL (ref 7.5–12.5)
Platelets: 173 10*3/uL (ref 140–400)
RBC: 5.68 10*6/uL (ref 4.20–5.80)
RDW: 13.4 % (ref 11.0–15.0)
WBC: 6.9 10*3/uL (ref 3.8–10.8)

## 2022-03-22 LAB — LIPID PANEL
Cholesterol: 237 mg/dL — ABNORMAL HIGH (ref <200)
HDL: 46 mg/dL (ref >=40)
LDL Cholesterol (Calc): 155 mg/dL (calc) — ABNORMAL HIGH
Non-HDL Cholesterol (Calc): 191 mg/dL (calc) — ABNORMAL HIGH (ref <130)
Total CHOL/HDL Ratio: 5.2 (calc) — ABNORMAL HIGH (ref <5.0)
Triglycerides: 196 mg/dL — ABNORMAL HIGH (ref <150)

## 2022-03-22 LAB — PSA, TOTAL AND FREE
PSA, % Free: 22 % (calc) — ABNORMAL LOW (ref >25)
PSA, Free: 0.2 ng/mL
PSA, Total: 0.9 ng/mL (ref <=4.0)

## 2022-03-22 LAB — HEMOGLOBIN A1C
Hgb A1c MFr Bld: 7.1 % of total Hgb — ABNORMAL HIGH (ref <5.7)
Mean Plasma Glucose: 157 mg/dL
eAG (mmol/L): 8.7 mmol/L

## 2022-03-22 LAB — TSH: TSH: 1.83 mIU/L (ref 0.40–4.50)

## 2022-04-24 ENCOUNTER — Encounter: Payer: Self-pay | Admitting: Family Medicine

## 2022-04-24 ENCOUNTER — Encounter: Payer: Self-pay | Admitting: *Deleted

## 2022-04-24 ENCOUNTER — Ambulatory Visit (INDEPENDENT_AMBULATORY_CARE_PROVIDER_SITE_OTHER): Payer: 59 | Admitting: Family Medicine

## 2022-04-24 VITALS — BP 118/80 | HR 86 | Ht 69.0 in | Wt 256.0 lb

## 2022-04-24 DIAGNOSIS — K219 Gastro-esophageal reflux disease without esophagitis: Secondary | ICD-10-CM

## 2022-04-24 DIAGNOSIS — D126 Benign neoplasm of colon, unspecified: Secondary | ICD-10-CM

## 2022-04-24 DIAGNOSIS — E119 Type 2 diabetes mellitus without complications: Secondary | ICD-10-CM

## 2022-04-24 DIAGNOSIS — I10 Essential (primary) hypertension: Secondary | ICD-10-CM

## 2022-04-24 DIAGNOSIS — Z0001 Encounter for general adult medical examination with abnormal findings: Secondary | ICD-10-CM | POA: Insufficient documentation

## 2022-04-24 DIAGNOSIS — E785 Hyperlipidemia, unspecified: Secondary | ICD-10-CM

## 2022-04-24 DIAGNOSIS — B351 Tinea unguium: Secondary | ICD-10-CM

## 2022-04-24 MED ORDER — RYBELSUS 7 MG PO TABS: 7.0000 mg | ORAL_TABLET | Freq: Every day | ORAL | 3 refills | Status: DC

## 2022-04-24 MED ORDER — TERBINAFINE HCL 250 MG PO TABS: 250.0000 mg | ORAL_TABLET | Freq: Every day | ORAL | 1 refills | Status: DC

## 2022-04-24 MED ORDER — RYBELSUS 3 MG PO TABS: ORAL_TABLET | ORAL | 0 refills | Status: DC

## 2022-04-24 MED ORDER — ROSUVASTATIN CALCIUM 20 MG PO TABS: 20.0000 mg | ORAL_TABLET | Freq: Every day | ORAL | 1 refills | Status: DC

## 2022-04-24 NOTE — Patient Instructions (Signed)
Follow-up in 14 weeks, call if you need me sooner.  Fasting lipid HbA1c, CMP and EGFR 3 to 5 days before next visit.  Microalb past due , do as soon as possible please  Please arrange for your diabetic eye exam as soon as possible  Need RSV vaccine, pls get this at your pharmacy  Very important that you get colonoscopy, you are referred again  Very important that you commit to change in food choices as well as starting regular exercise for 30 minutes 5 days a week to improve your overall health.  Blood pressure is excellent.  Blood sugar is uncontrolled and additional medication Rybelsus is prescribed for status starting lower dose of 3 mg daily and for the following tomorrow once 7 mg daily.  Continue metformin 1 daily as before.  Cholesterol is too high please reduce fried and fatty foods.  New for your cholesterol is rosuvastatin 20 mg daily.  Foot exam shows infected toenails, I have prescribed 12-week course of terbinafine.  If you again have stomach pain with this then you should not take it however I think it is worth a trial to see if the medicine itself is really a problem.  For reflux please change from caffeinated drinks to decaffeinated, and work on weight loss both of these things will reduce your symptoms of reflux.  It is safe for you to take Pepcid AC 1 daily as needed over-the-counter for uncontrolled reflux symptoms.  Thanks for choosing [facility], we consider it a privelige to serve you.

## 2022-04-24 NOTE — Progress Notes (Signed)
Roger Prince     MRN: [id number]      DOB: 10-07-1960   HPI: Patient is in for annual physical exam. Uncontrolled diabetes, hyperlipidemia and onychomycosis as well as obesity are addressed at this visit also C/o reflux symptoms from time to time, wants to know general mx with best OTC option Recent labs, are reviewed and med adjustments made Immunization is reviewed , and  updated if needed.    PE; BP 118/80   Pulse 86   Ht '5\' 9"'$  (1.753 m)   Wt 256 lb (116.1 kg)   SpO2 94%   BMI 37.80 kg/m   Pleasant male, alert and oriented x 3, in no cardio-pulmonary distress. Afebrile. HEENT No facial trauma or asymetry. Sinuses non tender. EOMI External ears normal,  Neck: supple, no adenopathy,JVD or thyromegaly.No bruits.  Chest: Clear to ascultation bilaterally.No crackles or wheezes. Non tender to palpation  Cardiovascular system; Heart sounds normal,  S1 and  S2 ,no S3.  No murmur, or thrill. Apical beat not displaced Peripheral pulses normal.  Abdomen: Soft, non tender,   Musculoskeletal exam: Full ROM of spine, hips , shoulders and knees. No deformity ,swelling or crepitus noted. No muscle wasting or atrophy.   Neurologic: Cranial nerves 2 to 12 intact. Power, tone ,sensation and reflexes normal throughout. No disturbance in gait. No tremor.  Skin: Intact, no ulceration, erythema , bilateral onychomycosis, severe. Pigmentation normal throughout  Psych; Normal mood and affect. Judgement and concentration normal   Assessment & Plan:  Annual visit for general adult medical examination with abnormal findings Annual exam as documented. Counseling done  re healthy lifestyle involving commitment to 150 minutes exercise per week, heart healthy diet, and attaining healthy weight.The importance of adequate sleep also discussed. Regular seat belt use and home safety, is also discussed. Changes in health habits are decided on by the patient with goals and time frames   set for achieving them. Immunization and cancer screening needs are specifically addressed at this visit.   Onychomycosis Severe bilateral , terbinafine prescribed  Hyperlipidemia with target LDL less than 100 Hyperlipidemia:Low fat diet discussed and encouraged.   Lipid Panel  Lab Results  Component Value Date   CHOL 237 (H) 03/20/2022   HDL 46 03/20/2022   LDLCALC 155 (H) 03/20/2022   TRIG 196 (H) 03/20/2022   CHOLHDL 5.2 (H) 03/20/2022     Start srestor 20 mg and reduce fat intake  Type 2 diabetes mellitus with hemoglobin A1c goal of less than 7.0% [facility]) Roger Prince is reminded of the importance of commitment to daily physical activity for 30 minutes or more, as able and the need to limit carbohydrate intake to 30 to 60 grams per meal to help with blood sugar control.   The need to take medication as prescribed, test blood sugar as directed, and to call between visits if there is a concern that blood sugar is uncontrolled is also discussed.   Roger Prince is reminded of the importance of daily foot exam, annual eye examination, and good blood sugar, blood pressure and cholesterol control.     Latest Ref Rng & Units 03/20/2022    8:18 AM 11/22/2019    9:01 AM 07/13/2019    7:39 AM 07/12/2019    3:59 PM 04/02/2019    8:08 AM  Diabetic Labs  HbA1c <5.7 % of total Hgb 7.1  6.3    6.3    6.3    6.3  7.4   7.4  Chol <200 mg/dL 237   129   149   HDL > OR = 40 mg/dL 46   30   38   Calc LDL mg/dL (calc) 155   64   90   Triglycerides <150 mg/dL 196   173   109   Creatinine 0.70 - 1.35 mg/dL 1.03   1.53  1.50  1.09       04/24/2022    1:53 PM 04/24/2022    1:52 PM 04/24/2022    1:04 PM 11/22/2019    9:06 AM 11/22/2019    8:30 AM 07/30/2019   10:40 AM 07/13/2019    2:12 PM  BP/Weight  Systolic BP 498 264 158 309 407 680 881  Diastolic BP 80 80 91 72 75 70 80  Wt. (Lbs)   256  236 249   BMI   37.8 kg/m2  34.85 kg/m2 36.77 kg/m2       04/24/2022    1:00 PM 11/22/2019    8:40 AM   Foot/eye exam completion dates  Foot Form Completion Done Done      Uncontrolled add rybelsus   Morbid obesity due to excess calories [facility])  Patient re-educated about  the importance of commitment to a  minimum of 150 minutes of exercise per week as able.  The importance of healthy food choices with portion control discussed, as well as eating regularly and within a 12 hour window most days. The need to choose "clean , green" food 50 to 75% of the time is discussed, as well as to make water the primary drink and set a goal of 64 ounces water daily.       04/24/2022    1:04 PM 11/22/2019    8:30 AM 07/30/2019   10:40 AM  Weight /BMI  Weight 256 lb 236 lb 249 lb  Height '5\' 9"'$  (1.753 m) '5\' 9"'$  (1.753 m) '5\' 9"'$  (1.753 m)  BMI 37.8 kg/m2 34.85 kg/m2 36.77 kg/m2      GERD (gastroesophageal reflux disease) Weight loss, stop caffeine , and use of pepcid one daily as needed are recommended, will f/u on this  Tubular adenoma of colon Colonoscopy past due, he understands the importance of this,  he is referred again

## 2022-04-24 NOTE — Assessment & Plan Note (Signed)

## 2022-04-24 NOTE — Progress Notes (Incomplete)
   Roger Prince     MRN: [id number]      DOB: 1960/09/14   HPI: Patient is in for annual physical exam. Uncontrolled diabetes, hyperlipidemia and onychomycosis as well as obesity are addressed at this visit also C/o reflux symptoms from time to time, wants to know general mx with best OTC option Recent labs, are reviewed and med adjustments made Immunization is reviewed , and  updated if needed.    PE; BP 118/80   Pulse 86   Ht '5\' 9"'$  (1.753 m)   Wt 256 lb (116.1 kg)   SpO2 94%   BMI 37.80 kg/m   Pleasant male, alert and oriented x 3, in no cardio-pulmonary distress. Afebrile. HEENT No facial trauma or asymetry. Sinuses non tender. EOMI External ears normal,  Neck: supple, no adenopathy,JVD or thyromegaly.No bruits.  Chest: Clear to ascultation bilaterally.No crackles or wheezes. Non tender to palpation  Cardiovascular system; Heart sounds normal,  S1 and  S2 ,no S3.  No murmur, or thrill. Apical beat not displaced Peripheral pulses normal.  Abdomen: Soft, non tender,   Musculoskeletal exam: Full ROM of spine, hips , shoulders and knees. No deformity ,swelling or crepitus noted. No muscle wasting or atrophy.   Neurologic: Cranial nerves 2 to 12 intact. Power, tone ,sensation and reflexes normal throughout. No disturbance in gait. No tremor.  Skin: Intact, no ulceration, erythema , bilateral onychomycosis, severe. Pigmentation normal throughout  Psych; Normal mood and affect. Judgement and concentration normal   Assessment & Plan:  No problem-specific Assessment & Plan notes found for this encounter.

## 2022-04-25 NOTE — Assessment & Plan Note (Signed)
Hyperlipidemia:Low fat diet discussed and encouraged.   Lipid Panel  Lab Results  Component Value Date   CHOL 237 (H) 03/20/2022   HDL 46 03/20/2022   LDLCALC 155 (H) 03/20/2022   TRIG 196 (H) 03/20/2022   CHOLHDL 5.2 (H) 03/20/2022     Start srestor 20 mg and reduce fat intake

## 2022-04-25 NOTE — Assessment & Plan Note (Signed)
Severe bilateral , terbinafine prescribed

## 2022-04-25 NOTE — Assessment & Plan Note (Addendum)
Roger Prince is reminded of the importance of commitment to daily physical activity for 30 minutes or more, as able and the need to limit carbohydrate intake to 30 to 60 grams per meal to help with blood sugar control.   The need to take medication as prescribed, test blood sugar as directed, and to call between visits if there is a concern that blood sugar is uncontrolled is also discussed.   Roger Prince is reminded of the importance of daily foot exam, annual eye examination, and good blood sugar, blood pressure and cholesterol control.     Latest Ref Rng & Units 03/20/2022    8:18 AM 11/22/2019    9:01 AM 07/13/2019    7:39 AM 07/12/2019    3:59 PM 04/02/2019    8:08 AM  Diabetic Labs  HbA1c <5.7 % of total Hgb 7.1  6.3    6.3    6.3    6.3  7.4   7.4   Chol <200 mg/dL 237   129   149   HDL > OR = 40 mg/dL 46   30   38   Calc LDL mg/dL (calc) 155   64   90   Triglycerides <150 mg/dL 196   173   109   Creatinine 0.70 - 1.35 mg/dL 1.03   1.53  1.50  1.09       04/24/2022    1:53 PM 04/24/2022    1:52 PM 04/24/2022    1:04 PM 11/22/2019    9:06 AM 11/22/2019    8:30 AM 07/30/2019   10:40 AM 07/13/2019    2:12 PM  BP/Weight  Systolic BP 465 681 275 170 017 494 496  Diastolic BP 80 80 91 72 75 70 80  Wt. (Lbs)   256  236 249   BMI   37.8 kg/m2  34.85 kg/m2 36.77 kg/m2       04/24/2022    1:00 PM 11/22/2019    8:40 AM  Foot/eye exam completion dates  Foot Form Completion Done Done      Uncontrolled add rybelsus

## 2022-04-25 NOTE — Assessment & Plan Note (Signed)
  Patient re-educated about  the importance of commitment to a  minimum of 150 minutes of exercise per week as able.  The importance of healthy food choices with portion control discussed, as well as eating regularly and within a 12 hour window most days. The need to choose "clean , green" food 50 to 75% of the time is discussed, as well as to make water the primary drink and set a goal of 64 ounces water daily.       04/24/2022    1:04 PM 11/22/2019    8:30 AM 07/30/2019   10:40 AM  Weight /BMI  Weight 256 lb 236 lb 249 lb  Height '5\' 9"'$  (1.753 m) '5\' 9"'$  (1.753 m) '5\' 9"'$  (1.753 m)  BMI 37.8 kg/m2 34.85 kg/m2 36.77 kg/m2

## 2022-04-25 NOTE — Assessment & Plan Note (Signed)
Colonoscopy past due, he understands the importance of this,  he is referred again

## 2022-04-25 NOTE — Assessment & Plan Note (Signed)
Weight loss, stop caffeine , and use of pepcid one daily as needed are recommended, will f/u on this

## 2022-05-25 ENCOUNTER — Other Ambulatory Visit: Payer: Self-pay | Admitting: Family Medicine

## 2022-05-27 ENCOUNTER — Other Ambulatory Visit: Payer: Self-pay | Admitting: Family Medicine

## 2022-07-31 ENCOUNTER — Ambulatory Visit: Payer: 59 | Admitting: Family Medicine

## 2022-08-25 ENCOUNTER — Other Ambulatory Visit: Payer: Self-pay | Admitting: Family Medicine

## 2022-08-26 MED ORDER — TRIAMTERENE-HCTZ 37.5-25 MG PO TABS: 1.0000 | ORAL_TABLET | Freq: Every day | ORAL | 0 refills | Status: DC

## 2022-08-26 MED ORDER — BENAZEPRIL HCL 20 MG PO TABS: 20.0000 mg | ORAL_TABLET | Freq: Every day | ORAL | 0 refills | Status: DC

## 2022-08-26 MED ORDER — METFORMIN HCL 500 MG PO TABS: 500.0000 mg | ORAL_TABLET | Freq: Every day | ORAL | 0 refills | Status: DC

## 2022-10-21 ENCOUNTER — Other Ambulatory Visit: Payer: Self-pay | Admitting: Family Medicine

## 2022-11-10 ENCOUNTER — Other Ambulatory Visit: Payer: Self-pay | Admitting: Family Medicine

## 2022-11-11 MED ORDER — METFORMIN HCL 500 MG PO TABS: 500.0000 mg | ORAL_TABLET | Freq: Every day | ORAL | 0 refills | Status: DC

## 2022-11-18 ENCOUNTER — Other Ambulatory Visit: Payer: Self-pay | Admitting: Family Medicine

## 2022-11-21 ENCOUNTER — Other Ambulatory Visit: Payer: Self-pay | Admitting: Family Medicine

## 2023-01-22 ENCOUNTER — Other Ambulatory Visit: Payer: Self-pay | Admitting: Family Medicine

## 2023-02-09 ENCOUNTER — Other Ambulatory Visit: Payer: Self-pay | Admitting: Family Medicine

## 2023-04-25 ENCOUNTER — Other Ambulatory Visit: Payer: Self-pay | Admitting: Family Medicine

## 2023-05-05 ENCOUNTER — Other Ambulatory Visit: Payer: Self-pay | Admitting: Family Medicine

## 2023-05-05 MED ORDER — ROSUVASTATIN CALCIUM 20 MG PO TABS: 20.0000 mg | ORAL_TABLET | Freq: Every day | ORAL | 0 refills | Status: DC

## 2023-05-14 ENCOUNTER — Other Ambulatory Visit: Payer: Self-pay | Admitting: Family Medicine

## 2023-08-14 ENCOUNTER — Other Ambulatory Visit: Payer: Self-pay | Admitting: Family Medicine

## 2023-11-11 ENCOUNTER — Other Ambulatory Visit: Payer: Self-pay | Admitting: Family Medicine

## 2023-11-14 ENCOUNTER — Other Ambulatory Visit: Payer: Self-pay | Admitting: Family Medicine

## 2023-11-14 NOTE — Telephone Encounter (Signed)
 Copied from CRM [id number]. Topic: Clinical - Medication Refill >> Nov 14, 2023  7:51 AM Charlet HERO wrote: Medication: benazepril  (LOTENSIN ) 20 MG tablet rosuvastatin  (CRESTOR ) 20 MG tablet,metFORMIN  (GLUCOPHAGE ) 500 MG tablet,terbinafine  (LAMISIL ) 250 MG tablet  Has the patient contacted their pharmacy? Yes No refills left , does have an appt scheduled for 07/31  This is the patient's preferred pharmacy:  [facility] [address] [place] - [address] [place] [address] Phone: [phone] Fax: [phone]    Is this the correct pharmacy for this prescription? Yes If no, delete pharmacy and type the correct one.   Has the prescription been filled recently? Yes  Is the patient out of the medication? Yes  Has the patient been seen for an appointment in the last year OR does the patient have an upcoming appointment? Yes  Can we respond through MyChart? No  Agent: Please be advised that Rx refills may take up to 3 business days. We ask that you follow-up with your pharmacy.

## 2023-11-17 NOTE — Telephone Encounter (Signed)
 The patient called in checking on the status of his refill requests for 4 meds. I spoke with Caelyn and she said she would pass this on to a provider to get sent in as soon as possible. I informed the patient as well and that he would be given a call to let him know when it has been done. He needs at least enough to get him to his appointment on 7/30. Please assist patient further.

## 2023-11-25 ENCOUNTER — Telehealth: Payer: Self-pay | Admitting: Family Medicine

## 2023-11-25 ENCOUNTER — Other Ambulatory Visit: Payer: Self-pay

## 2023-11-25 MED ORDER — BENAZEPRIL HCL 20 MG PO TABS: 20.0000 mg | ORAL_TABLET | Freq: Every day | ORAL | 0 refills | Status: DC

## 2023-11-25 MED ORDER — TRIAMTERENE-HCTZ 37.5-25 MG PO TABS: 1.0000 | ORAL_TABLET | Freq: Every day | ORAL | 0 refills | Status: DC

## 2023-11-25 MED ORDER — ROSUVASTATIN CALCIUM 20 MG PO TABS: 20.0000 mg | ORAL_TABLET | Freq: Every day | ORAL | 0 refills | Status: DC

## 2023-11-25 MED ORDER — METFORMIN HCL 500 MG PO TABS: 500.0000 mg | ORAL_TABLET | Freq: Every day | ORAL | 0 refills | Status: DC

## 2023-11-25 NOTE — Telephone Encounter (Signed)
 error

## 2023-11-26 ENCOUNTER — Encounter (INDEPENDENT_AMBULATORY_CARE_PROVIDER_SITE_OTHER): Payer: Self-pay | Admitting: *Deleted

## 2023-11-26 ENCOUNTER — Encounter: Payer: Self-pay | Admitting: Family Medicine

## 2023-11-26 ENCOUNTER — Ambulatory Visit (INDEPENDENT_AMBULATORY_CARE_PROVIDER_SITE_OTHER): Payer: Self-pay | Admitting: Family Medicine

## 2023-11-26 VITALS — BP 144/98 | HR 78 | Resp 18 | Ht 69.0 in | Wt 262.1 lb

## 2023-11-26 DIAGNOSIS — Z125 Encounter for screening for malignant neoplasm of prostate: Secondary | ICD-10-CM | POA: Insufficient documentation

## 2023-11-26 DIAGNOSIS — E785 Hyperlipidemia, unspecified: Secondary | ICD-10-CM

## 2023-11-26 DIAGNOSIS — Z23 Encounter for immunization: Secondary | ICD-10-CM | POA: Diagnosis not present

## 2023-11-26 DIAGNOSIS — D126 Benign neoplasm of colon, unspecified: Secondary | ICD-10-CM

## 2023-11-26 DIAGNOSIS — E119 Type 2 diabetes mellitus without complications: Secondary | ICD-10-CM

## 2023-11-26 DIAGNOSIS — I10 Essential (primary) hypertension: Secondary | ICD-10-CM | POA: Diagnosis not present

## 2023-11-26 DIAGNOSIS — Z6838 Body mass index (BMI) 38.0-38.9, adult: Secondary | ICD-10-CM

## 2023-11-26 DIAGNOSIS — E559 Vitamin D deficiency, unspecified: Secondary | ICD-10-CM

## 2023-11-26 NOTE — Assessment & Plan Note (Signed)
 Repy /  update psa

## 2023-11-26 NOTE — Assessment & Plan Note (Signed)
 After obtaining informed consent, the pneumonia 20  vaccine is  administered , with no adverse effect noted at the time of administration.

## 2023-11-26 NOTE — Assessment & Plan Note (Signed)
 Diabetes associated with hypertension, hyperlipidemia, obesity, and arthritis  Roger Prince is reminded of the importance of commitment to daily physical activity for 30 minutes or more, as able and the need to limit carbohydrate intake to 30 to 60 grams per meal to help with blood sugar control.   The need to take medication as prescribed, test blood sugar as directed, and to call between visits if there is a concern that blood sugar is uncontrolled is also discussed.   Roger Prince is reminded of the importance of daily foot exam, annual eye examination, and good blood sugar, blood pressure and cholesterol control.     Latest Ref Rng & Units 03/20/2022    8:18 AM 11/22/2019    9:01 AM 07/13/2019    7:39 AM 07/12/2019    3:59 PM 04/02/2019    8:08 AM  Diabetic Labs  HbA1c <5.7 % of total Hgb 7.1  6.3    6.3    6.3    6.3  7.4   7.4   Chol <200 mg/dL 762   870   850   HDL > OR = 40 mg/dL 46   30   38   Calc LDL mg/dL (calc) 844   64   90   Triglycerides <150 mg/dL 803   826   890   Creatinine 0.70 - 1.35 mg/dL 8.96   8.46  8.49  8.90       11/26/2023    1:26 PM 11/26/2023    1:00 PM 04/24/2022    1:53 PM 04/24/2022    1:52 PM 04/24/2022    1:04 PM 11/22/2019    9:06 AM 11/22/2019    8:30 AM  BP/Weight  Systolic BP 144 153 118 120 130 120 102  Diastolic BP 98 94 80 80 91 72 75  Wt. (Lbs)  262.12   256  236  BMI  38.71 kg/m2   37.8 kg/m2  34.85 kg/m2      04/24/2022    1:00 PM 11/22/2019    8:40 AM  Foot/eye exam completion dates  Foot Form Completion Done Done    Updated lab needed at/ before next visit.

## 2023-11-26 NOTE — Assessment & Plan Note (Signed)
  Patient re-educated about  the importance of commitment to a  minimum of 150 minutes of exercise per week as able.  The importance of healthy food choices with portion control discussed, as well as eating regularly and within a 12 hour window most days. The need to choose clean , green food 50 to 75% of the time is discussed, as well as to make water  the primary drink and set a goal of 64 ounces water  daily.       11/26/2023    1:00 PM 04/24/2022    1:04 PM 11/22/2019    8:30 AM  Weight /BMI  Weight 262 lb 1.9 oz 256 lb 236 lb  Height 5' 9 (1.753 m) 5' 9 (1.753 m) 5' 9 (1.753 m)  BMI 38.71 kg/m2 37.8 kg/m2 34.85 kg/m2    deteriorated

## 2023-11-26 NOTE — Progress Notes (Signed)
 Roger Prince     MRN: [id number]      DOB: November 28, 1960  Chief Complaint  Patient presents with   Medical Management of Chronic Issues    Follow up. Last seen 2023     HPI Roger Prince is here for follow up and re-evaluation of chronic medical conditions, medication management and review of any available recent lab and radiology data.  Preventive health is updated, specifically  Cancer screening and Immunization.   Questions or concerns regarding consultations or procedures which the PT has had in the interim are  addressed. Out of meds x 2 weeks , just started back some 1 day ago , had noticed ankle swelling.dp Not testing blood sugaer  ROS Denies recent fever or chills. Denies sinus pressure, nasal congestion, ear pain or sore throat. Denies chest congestion, productive cough or wheezing. Denies chest pains, palpitations, PND or orthopnea Denies abdominal pain, nausea, vomiting,diarrhea or constipation.   Denies dysuria, frequency, hesitancy or incontinence. Denies joint pain, swelling and limitation in mobility. Denies headaches, seizures, numbness, or tingling. Denies depression, anxiety or insomnia. Denies skin break down or rash.   PE  BP (!) 144/98   Pulse 78   Resp 18   Ht 5' 9 (1.753 m)   Wt 262 lb 1.9 oz (118.9 kg)   SpO2 93%   BMI 38.71 kg/m   Patient alert and oriented and in no cardiopulmonary distress.  HEENT: No facial asymmetry, EOMI,     Neck supple .  Chest: Clear to auscultation bilaterally.  CVS: S1, S2 no murmurs, no S3.Regular rate.  ABD: Soft non tender.   Ext: No edema  MS: Adequate ROM spine, shoulders, hips and knees.  Skin: Intact, no ulcerations or rash noted.  Psych: Good eye contact, normal affect. Memory intact not anxious or depressed appearing.  CNS: CN 2-12 intact, power,  normal throughout.no focal deficits noted.   Assessment & Plan  Tubular adenoma of colon 4 tubular adenomas in 2015, colonoscopy past due, will  refer  Essential hypertension Uncontrolled , re eval in 4 weeks DASH diet and commitment to daily physical activity for a minimum of 30 minutes discussed and encouraged, as a part of hypertension management. The importance of attaining a healthy weight is also discussed.     11/26/2023    1:26 PM 11/26/2023    1:00 PM 04/24/2022    1:53 PM 04/24/2022    1:52 PM 04/24/2022    1:04 PM 11/22/2019    9:06 AM 11/22/2019    8:30 AM  BP/Weight  Systolic BP 144 153 118 120 130 120 102  Diastolic BP 98 94 80 80 91 72 75  Wt. (Lbs)  262.12   256  236  BMI  38.71 kg/m2   37.8 kg/m2  34.85 kg/m2       Hyperlipidemia with target LDL less than 100 Hyperlipidemia:Low fat diet discussed and encouraged.   Lipid Panel  Lab Results  Component Value Date   CHOL 237 (H) 03/20/2022   HDL 46 03/20/2022   LDLCALC 155 (H) 03/20/2022   TRIG 196 (H) 03/20/2022   CHOLHDL 5.2 (H) 03/20/2022     Updated lab needed at/ before next visit.   Type 2 diabetes mellitus with hemoglobin A1c goal of less than 7.0% (HCC) Diabetes associated with hypertension, hyperlipidemia, obesity, and arthritis  Roger Prince is reminded of the importance of commitment to daily physical activity for 30 minutes or more, as able and the need to limit carbohydrate  intake to 30 to 60 grams per meal to help with blood sugar control.   The need to take medication as prescribed, test blood sugar as directed, and to call between visits if there is a concern that blood sugar is uncontrolled is also discussed.   Roger Prince is reminded of the importance of daily foot exam, annual eye examination, and good blood sugar, blood pressure and cholesterol control.     Latest Ref Rng & Units 03/20/2022    8:18 AM 11/22/2019    9:01 AM 07/13/2019    7:39 AM 07/12/2019    3:59 PM 04/02/2019    8:08 AM  Diabetic Labs  HbA1c <5.7 % of total Hgb 7.1  6.3    6.3    6.3    6.3  7.4   7.4   Chol <200 mg/dL 762   870   850   HDL > OR = 40 mg/dL 46    30   38   Calc LDL mg/dL (calc) 844   64   90   Triglycerides <150 mg/dL 803   826   890   Creatinine 0.70 - 1.35 mg/dL 8.96   8.46  8.49  8.90       11/26/2023    1:26 PM 11/26/2023    1:00 PM 04/24/2022    1:53 PM 04/24/2022    1:52 PM 04/24/2022    1:04 PM 11/22/2019    9:06 AM 11/22/2019    8:30 AM  BP/Weight  Systolic BP 144 153 118 120 130 120 102  Diastolic BP 98 94 80 80 91 72 75  Wt. (Lbs)  262.12   256  236  BMI  38.71 kg/m2   37.8 kg/m2  34.85 kg/m2      04/24/2022    1:00 PM 11/22/2019    8:40 AM  Foot/eye exam completion dates  Foot Form Completion Done Done    Updated lab needed at/ before next visit.   Morbid obesity due to excess calories [facility])  Patient re-educated about  the importance of commitment to a  minimum of 150 minutes of exercise per week as able.  The importance of healthy food choices with portion control discussed, as well as eating regularly and within a 12 hour window most days. The need to choose clean , green food 50 to 75% of the time is discussed, as well as to make water  the primary drink and set a goal of 64 ounces water  daily.       11/26/2023    1:00 PM 04/24/2022    1:04 PM 11/22/2019    8:30 AM  Weight /BMI  Weight 262 lb 1.9 oz 256 lb 236 lb  Height 5' 9 (1.753 m) 5' 9 (1.753 m) 5' 9 (1.753 m)  BMI 38.71 kg/m2 37.8 kg/m2 34.85 kg/m2    deteriorated  Encounter for immunization After obtaining informed consent, the pneumonia 20 vaccine is  administered , with no adverse effect noted at the time of administration.   Vitamin D deficiency Updated lab needed at/ before next visit.   Special screening for malignant neoplasm of prostate Repy /  update psa

## 2023-11-26 NOTE — Assessment & Plan Note (Signed)
 Updated lab needed at/ before next visit.

## 2023-11-26 NOTE — Assessment & Plan Note (Signed)
 Hyperlipidemia:Low fat diet discussed and encouraged.   Lipid Panel  Lab Results  Component Value Date   CHOL 237 (H) 03/20/2022   HDL 46 03/20/2022   LDLCALC 155 (H) 03/20/2022   TRIG 196 (H) 03/20/2022   CHOLHDL 5.2 (H) 03/20/2022     Updated lab needed at/ before next visit.

## 2023-11-26 NOTE — Patient Instructions (Addendum)
 Annual in 4 weeks pls  Lads in am  cBC, Fasting lipid, cmp and EGFr, hBA1C, TSH, Vit D, PSA and microalb   Pls assist pt with logging into  My Chart at checkout  Pneumonia 20 today  Pls respond to questionnaire mailed for your colonoscopy, this iis overdue  Pls schedule diabetic eye exam as soon as possible    It is important that you exercise regularly at least 30 minutes 5 times a week. If you develop chest pain, have severe difficulty breathing, or feel very tired, stop exercising immediately and seek medical attention    Think about what you will eat, plan ahead. Choose  clean, green, fresh or frozen over canned, processed or packaged foods which are more sugary, salty and fatty. 70 to 75% of food eaten should be vegetables and fruit. Three meals at set times with snacks allowed between meals, but they must be fruit or vegetables. Aim to eat over a 12 hour period , example 7 am to 7 pm, and STOP after  your last meal of the day. Drink water ,generally about 64 ounces per day, no other drink is as healthy. Fruit juice is best enjoyed in a healthy way, by EATING the fruit.   Thanks for choosing [facility], we consider it a privelige to serve you.

## 2023-11-26 NOTE — Assessment & Plan Note (Signed)
 4 tubular adenomas in 2015, colonoscopy past due, will refer

## 2023-11-26 NOTE — Assessment & Plan Note (Signed)
 Uncontrolled , re eval in 4 weeks DASH diet and commitment to daily physical activity for a minimum of 30 minutes discussed and encouraged, as a part of hypertension management. The importance of attaining a healthy weight is also discussed.     11/26/2023    1:26 PM 11/26/2023    1:00 PM 04/24/2022    1:53 PM 04/24/2022    1:52 PM 04/24/2022    1:04 PM 11/22/2019    9:06 AM 11/22/2019    8:30 AM  BP/Weight  Systolic BP 144 153 118 120 130 120 102  Diastolic BP 98 94 80 80 91 72 75  Wt. (Lbs)  262.12   256  236  BMI  38.71 kg/m2   37.8 kg/m2  34.85 kg/m2

## 2023-11-28 ENCOUNTER — Ambulatory Visit: Payer: Self-pay | Admitting: Family Medicine

## 2023-11-28 ENCOUNTER — Other Ambulatory Visit (HOSPITAL_COMMUNITY): Payer: Self-pay

## 2023-11-28 ENCOUNTER — Telehealth: Payer: Self-pay | Admitting: Pharmacy Technician

## 2023-11-28 ENCOUNTER — Encounter: Payer: Self-pay | Admitting: Family Medicine

## 2023-11-28 LAB — MICROALBUMIN / CREATININE URINE RATIO
Creatinine, Urine: 22.4 mg/dL
Microalb/Creat Ratio: <13 mg/g{creat} (ref 0–29)
Microalbumin, Urine: <3 ug/mL

## 2023-11-28 LAB — CBC WITH DIFFERENTIAL/PLATELET
Basophils Absolute: 0 x10E3/uL (ref 0.0–0.2)
Basos: 0 %
EOS (ABSOLUTE): 0.1 x10E3/uL (ref 0.0–0.4)
Eos: 2 %
Hematocrit: 44.1 % (ref 37.5–51.0)
Hemoglobin: 13.9 g/dL (ref 13.0–17.7)
Immature Grans (Abs): 0 x10E3/uL (ref 0.0–0.1)
Immature Granulocytes: 0 %
Lymphocytes Absolute: 2.1 x10E3/uL (ref 0.7–3.1)
Lymphs: 31 %
MCH: 26.7 pg (ref 26.6–33.0)
MCHC: 31.5 g/dL (ref 31.5–35.7)
MCV: 85 fL (ref 79–97)
Monocytes Absolute: 0.5 x10E3/uL (ref 0.1–0.9)
Monocytes: 7 %
Neutrophils Absolute: 4 x10E3/uL (ref 1.4–7.0)
Neutrophils: 60 %
Platelets: 148 x10E3/uL — ABNORMAL LOW (ref 150–450)
RBC: 5.2 x10E6/uL (ref 4.14–5.80)
RDW: 14.1 % (ref 11.6–15.4)
WBC: 6.7 x10E3/uL (ref 3.4–10.8)

## 2023-11-28 LAB — CMP14+EGFR
ALT: 24 IU/L (ref 0–44)
AST: 27 IU/L (ref 0–40)
Albumin: 4 g/dL (ref 3.9–4.9)
Alkaline Phosphatase: 77 IU/L (ref 44–121)
BUN/Creatinine Ratio: 9 — ABNORMAL LOW (ref 10–24)
BUN: 10 mg/dL (ref 8–27)
Bilirubin Total: 0.5 mg/dL (ref 0.0–1.2)
CO2: 20 mmol/L (ref 20–29)
Calcium: 9.9 mg/dL (ref 8.6–10.2)
Chloride: 100 mmol/L (ref 96–106)
Creatinine, Ser: 1.16 mg/dL (ref 0.76–1.27)
Globulin, Total: 3.1 g/dL (ref 1.5–4.5)
Glucose: 136 mg/dL — ABNORMAL HIGH (ref 70–99)
Potassium: 4.7 mmol/L (ref 3.5–5.2)
Sodium: 137 mmol/L (ref 134–144)
Total Protein: 7.1 g/dL (ref 6.0–8.5)
eGFR: 71 mL/min/1.73 (ref >59)

## 2023-11-28 LAB — VITAMIN D 25 HYDROXY (VIT D DEFICIENCY, FRACTURES): Vit D, 25-Hydroxy: 17 ng/mL — ABNORMAL LOW (ref 30.0–100.0)

## 2023-11-28 LAB — HEMOGLOBIN A1C
Est. average glucose Bld gHb Est-mCnc: 174 mg/dL
Hgb A1c MFr Bld: 7.7 % — ABNORMAL HIGH (ref 4.8–5.6)

## 2023-11-28 LAB — TSH: TSH: 1.79 u[IU]/mL (ref 0.450–4.500)

## 2023-11-28 LAB — PSA: Prostate Specific Ag, Serum: 0.8 ng/mL (ref 0.0–4.0)

## 2023-11-28 MED ORDER — OZEMPIC (0.25 OR 0.5 MG/DOSE) 2 MG/1.5ML ~~LOC~~ SOPN: 0.2500 mg | PEN_INJECTOR | SUBCUTANEOUS | 0 refills | Status: DC

## 2023-11-28 MED ORDER — OZEMPIC (0.25 OR 0.5 MG/DOSE) 2 MG/3ML ~~LOC~~ SOPN
0.5000 mg | PEN_INJECTOR | SUBCUTANEOUS | 3 refills | Status: DC
Start: 2023-12-26 — End: 2023-12-30

## 2023-11-28 MED ORDER — VITAMIN D (ERGOCALCIFEROL) 1.25 MG (50000 UNIT) PO CAPS: 50000.0000 [IU] | ORAL_CAPSULE | ORAL | 8 refills | Status: AC

## 2023-11-28 NOTE — Telephone Encounter (Signed)
 Pharmacy Patient Advocate Encounter   Received notification from CoverMyMeds that prior authorization for Ozempic (0.25 or 0.5 MG/DOSE) 2MG /3ML pen-injectors is required/requested.   Insurance verification completed.   The patient is insured through [facility] .   Per test claim: PA required; PA submitted to above mentioned insurance via LATENT Key/confirmation #/EOC [id number] Status is pending

## 2023-12-01 ENCOUNTER — Other Ambulatory Visit (HOSPITAL_COMMUNITY): Payer: Self-pay

## 2023-12-01 NOTE — Telephone Encounter (Signed)
 Pharmacy Patient Advocate Encounter  Received notification from [facility] that Prior Authorization for Ozempic  (0.25 or 0.5 MG/DOSE) 2MG /3ML pen-injectors  has been APPROVED from 12/01/2023 to 11/30/2024. Ran test claim, Copay is $25.00. This test claim was processed through [facility]- copay amounts may vary at other pharmacies due to pharmacy/plan contracts, or as the patient moves through the different stages of their insurance plan.   PA #/Case ID/Reference #: [id number]

## 2023-12-30 ENCOUNTER — Ambulatory Visit: Admitting: Family Medicine

## 2023-12-30 ENCOUNTER — Encounter: Payer: Self-pay | Admitting: Family Medicine

## 2023-12-30 VITALS — BP 120/84 | HR 107 | Resp 16 | Ht 69.0 in | Wt 257.0 lb

## 2023-12-30 DIAGNOSIS — E785 Hyperlipidemia, unspecified: Secondary | ICD-10-CM

## 2023-12-30 DIAGNOSIS — Z23 Encounter for immunization: Secondary | ICD-10-CM | POA: Diagnosis not present

## 2023-12-30 DIAGNOSIS — I10 Essential (primary) hypertension: Secondary | ICD-10-CM | POA: Diagnosis not present

## 2023-12-30 DIAGNOSIS — Z0001 Encounter for general adult medical examination with abnormal findings: Secondary | ICD-10-CM

## 2023-12-30 DIAGNOSIS — E119 Type 2 diabetes mellitus without complications: Secondary | ICD-10-CM

## 2023-12-30 DIAGNOSIS — Z0000 Encounter for general adult medical examination without abnormal findings: Secondary | ICD-10-CM | POA: Insufficient documentation

## 2023-12-30 MED ORDER — BENAZEPRIL HCL 20 MG PO TABS: 20.0000 mg | ORAL_TABLET | Freq: Every day | ORAL | 3 refills | Status: DC

## 2023-12-30 MED ORDER — TRIAMTERENE-HCTZ 37.5-25 MG PO TABS: 1.0000 | ORAL_TABLET | Freq: Every day | ORAL | 3 refills | Status: DC

## 2023-12-30 MED ORDER — METFORMIN HCL 500 MG PO TABS: 500.0000 mg | ORAL_TABLET | Freq: Two times a day (BID) | ORAL | 1 refills | Status: DC

## 2023-12-30 NOTE — Assessment & Plan Note (Signed)
 Annual exam as documented. . Immunization and cancer screening needs are specifically addressed at this visit.

## 2023-12-30 NOTE — Assessment & Plan Note (Signed)
 After obtaining informed consent, the vaccine is  administered , with no adverse effect noted at the time of administration.

## 2023-12-30 NOTE — Patient Instructions (Addendum)
 F/u in Feb  Pls get fasting lipid, cmp and EGFR and HBA1C 2nd week in November  Increase metformin  to twice daily   Flu vaccine today  Please arrange colonoscopy and eye exam  It is important that you exercise regularly at least 30 minutes 5 times a week. If you develop chest pain, have severe difficulty breathing, or feel very tired, stop exercising immediately and seek medical attention   Think about what you will eat, plan ahead. Choose  clean, green, fresh or frozen over canned, processed or packaged foods which are more sugary, salty and fatty. 70 to 75% of food eaten should be vegetables and fruit. Three meals at set times with snacks allowed between meals, but they must be fruit or vegetables. Aim to eat over a 12 hour period , example 7 am to 7 pm, and STOP after  your last meal of the day. Drink water ,generally about 64 ounces per day, no other drink is as healthy. Fruit juice is best enjoyed in a healthy way, by EATING the fruit.   Thanks for choosing [facility], we consider it a privelige to serve you.

## 2023-12-30 NOTE — Progress Notes (Signed)
   Roger Prince     MRN: [id number]      DOB: Feb 07, 1961  Chief Complaint  Patient presents with   Annual Exam    HPI: Patient is in for annual physical exam. No other health concerns are expressed or addressed at the visit. Recent labs, are reviewed. Immunization is reviewed , and  updated     PE; BP 120/84   Pulse (!) 107   Resp 16   Ht 5' 9 (1.753 m)   Wt 257 lb 0.6 oz (116.6 kg)   SpO2 93%   BMI 37.96 kg/m   Pleasant male, alert and oriented x 3, in no cardio-pulmonary distress. Afebrile. HEENT No facial trauma or asymetry. Sinuses non tender. EOMI External ears normal,  Neck: supple, no adenopathy,JVD or thyromegaly.No bruits.  Chest: Clear to ascultation bilaterally.No crackles or wheezes. Non tender to palpation  Cardiovascular system; Heart sounds normal,  S1 and  S2 ,no S3.  No murmur, or thrill. Apical beat not displaced Peripheral pulses normal.  Abdomen: Soft, non tender, no organomegaly or masses. No bruits. Bowel sounds normal. No guarding, tenderness or rebound.    Musculoskeletal exam: Full ROM of spine, hips , shoulders and knees. No deformity ,swelling or crepitus noted. No muscle wasting or atrophy.   Neurologic: Cranial nerves 2 to 12 intact. Power, tone ,sensation and reflexes normal throughout. No disturbance in gait. No tremor.  Skin: Intact, no ulceration, erythema , scaling or rash noted. Pigmentation normal throughout  Psych; Normal mood and affect. Judgement and concentration normal   Assessment & Plan:  Annual visit for general adult medical examination with abnormal findings Annual exam as documented. Immunization and cancer screening needs are specifically addressed at this visit.   Need for immunization against influenza After obtaining informed consent, the vaccine is  administered , with no adverse effect noted at the time of administration.

## 2023-12-30 NOTE — Assessment & Plan Note (Signed)
 Uncontrolled inc metformin  to twice daily and change food choice Updated lab needed at/ before next visit. Diabetes associated with hypertension, hyperlipidemia, and obesity  Roger Prince is reminded of the importance of commitment to daily physical activity for 30 minutes or more, as able and the need to limit carbohydrate intake to 30 to 60 grams per meal to help with blood sugar control.   The need to take medication as prescribed, test blood sugar as directed, and to call between visits if there is a concern that blood sugar is uncontrolled is also discussed.   Roger Prince is reminded of the importance of daily foot exam, annual eye examination, and good blood sugar, blood pressure and cholesterol control.     Latest Ref Rng & Units 11/27/2023    8:03 AM 03/20/2022    8:18 AM 11/22/2019    9:01 AM 07/13/2019    7:39 AM 07/12/2019    3:59 PM  Diabetic Labs  HbA1c 4.8 - 5.6 % 7.7  7.1  6.3    6.3    6.3    6.3  7.4    Micro/Creat Ratio 0 - 29 mg/g creat <13       Chol <200 mg/dL  762   870    HDL > OR = 40 mg/dL  46   30    Calc LDL mg/dL (calc)  844   64    Triglycerides <150 mg/dL  803   826    Creatinine 0.76 - 1.27 mg/dL 8.83  8.96   8.46  8.49       12/30/2023    3:44 PM 11/26/2023    1:26 PM 11/26/2023    1:00 PM 04/24/2022    1:53 PM 04/24/2022    1:52 PM 04/24/2022    1:04 PM 11/22/2019    9:06 AM  BP/Weight  Systolic BP 120 144 153 118 120 130 120  Diastolic BP 84 98 94 80 80 91 72  Wt. (Lbs) 257.04  262.12   256   BMI 37.96 kg/m2  38.71 kg/m2   37.8 kg/m2       12/30/2023    3:40 PM 04/24/2022    1:00 PM  Foot/eye exam completion dates  Foot Form Completion Done Done

## 2024-02-19 ENCOUNTER — Other Ambulatory Visit: Payer: Self-pay | Admitting: Family Medicine

## 2024-02-20 ENCOUNTER — Other Ambulatory Visit: Payer: Self-pay | Admitting: Family Medicine

## 2024-02-20 MED ORDER — ROSUVASTATIN CALCIUM 20 MG PO TABS: 20.0000 mg | ORAL_TABLET | Freq: Every day | ORAL | 0 refills | Status: DC

## 2024-02-20 MED ORDER — TRIAMTERENE-HCTZ 37.5-25 MG PO TABS: 1.0000 | ORAL_TABLET | Freq: Every day | ORAL | 3 refills | Status: AC

## 2024-02-20 MED ORDER — BENAZEPRIL HCL 20 MG PO TABS: 20.0000 mg | ORAL_TABLET | Freq: Every day | ORAL | 3 refills | Status: AC

## 2024-02-20 NOTE — Telephone Encounter (Signed)
 Copied from CRM [id number]. Topic: Clinical - Medication Refill >> Feb 20, 2024  1:04 PM Sophia H wrote: Medication: metFORMIN  (GLUCOPHAGE ) 500 MG tablet rosuvastatin  (CRESTOR ) 20 MG tablet benazepril  (LOTENSIN ) 20 MG tablet triamterene -hydrochlorothiazide (MAXZIDE-25) 37.5-25 MG tablet  Has the patient contacted their pharmacy? Yes, told out of refills. Needs updated RX  This is the patient's preferred pharmacy:  [facility] [address] [place] - [address] [place] [place] [address] Phone: [phone] Fax: [phone]  Is this the correct pharmacy for this prescription? Yes If no, delete pharmacy and type the correct one.   Has the prescription been filled recently? Yes  Is the patient out of the medication? No, has 3 left of each.   Has the patient been seen for an appointment in the last year OR does the patient have an upcoming appointment? Yes, seen 12/2023.  Can we respond through MyChart? Yes  Agent: Please be advised that Rx refills may take up to 3 business days. We ask that you follow-up with your pharmacy.

## 2024-05-18 ENCOUNTER — Other Ambulatory Visit: Payer: Self-pay | Admitting: Family Medicine

## 2024-06-01 ENCOUNTER — Ambulatory Visit: Admitting: Family Medicine
